# Patient Record
Sex: Female | Born: 1975 | Race: White | Hispanic: No | Smoking: Current every day smoker
Health system: Southern US, Community
[De-identification: ages and names within clinical notes are randomized; demographics above are authoritative.]

## PROBLEM LIST (undated history)

## (undated) DIAGNOSIS — J45909 Unspecified asthma, uncomplicated: Secondary | ICD-10-CM

## (undated) DIAGNOSIS — O039 Complete or unspecified spontaneous abortion without complication: Secondary | ICD-10-CM

## (undated) DIAGNOSIS — E785 Hyperlipidemia, unspecified: Secondary | ICD-10-CM

## (undated) DIAGNOSIS — R87619 Unspecified abnormal cytological findings in specimens from cervix uteri: Secondary | ICD-10-CM

## (undated) DIAGNOSIS — Z87442 Personal history of urinary calculi: Secondary | ICD-10-CM

## (undated) DIAGNOSIS — N200 Calculus of kidney: Secondary | ICD-10-CM

## (undated) DIAGNOSIS — I10 Essential (primary) hypertension: Secondary | ICD-10-CM

## (undated) DIAGNOSIS — K219 Gastro-esophageal reflux disease without esophagitis: Secondary | ICD-10-CM

## (undated) DIAGNOSIS — K649 Unspecified hemorrhoids: Secondary | ICD-10-CM

## (undated) DIAGNOSIS — E039 Hypothyroidism, unspecified: Secondary | ICD-10-CM

## (undated) DIAGNOSIS — F329 Major depressive disorder, single episode, unspecified: Secondary | ICD-10-CM

## (undated) DIAGNOSIS — F419 Anxiety disorder, unspecified: Secondary | ICD-10-CM

## (undated) DIAGNOSIS — R7989 Other specified abnormal findings of blood chemistry: Secondary | ICD-10-CM

## (undated) HISTORY — DX: Hyperlipidemia, unspecified: E78.5

## (undated) HISTORY — DX: Unspecified hemorrhoids: K64.9

## (undated) HISTORY — DX: Unspecified abnormal cytological findings in specimens from cervix uteri: R87.619

## (undated) HISTORY — DX: Essential (primary) hypertension: I10

## (undated) HISTORY — DX: Other specified abnormal findings of blood chemistry: R79.89

## (undated) HISTORY — DX: Anxiety disorder, unspecified: F41.9

## (undated) HISTORY — PX: ABDOMINAL HYSTERECTOMY: SHX81

## (undated) HISTORY — DX: Complete or unspecified spontaneous abortion without complication: O03.9

## (undated) HISTORY — DX: Unspecified asthma, uncomplicated: J45.909

## (undated) HISTORY — DX: Calculus of kidney: N20.0

## (undated) HISTORY — PX: OTHER SURGICAL HISTORY: SHX169

---
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
## 1999-09-30 DIAGNOSIS — N200 Calculus of kidney: Secondary | ICD-10-CM

## 1999-09-30 HISTORY — DX: Calculus of kidney: N20.0

## 2005-01-11 ENCOUNTER — Observation Stay: Payer: Self-pay

## 2005-01-12 ENCOUNTER — Inpatient Hospital Stay: Payer: Self-pay | Admitting: Unknown Physician Specialty

## 2009-11-09 ENCOUNTER — Ambulatory Visit: Payer: Self-pay | Admitting: Unknown Physician Specialty

## 2012-09-29 HISTORY — PX: ANAL SPHINCTEROTOMY: SHX1140

## 2012-09-29 HISTORY — PX: ANAL FISSURECTOMY: SUR608

## 2012-12-23 ENCOUNTER — Ambulatory Visit (INDEPENDENT_AMBULATORY_CARE_PROVIDER_SITE_OTHER): Payer: BC Managed Care – PPO | Admitting: General Surgery

## 2012-12-23 ENCOUNTER — Encounter: Payer: Self-pay | Admitting: General Surgery

## 2012-12-23 VITALS — BP 132/78 | HR 72 | Resp 12 | Ht 62.0 in | Wt 179.0 lb

## 2012-12-23 DIAGNOSIS — K602 Anal fissure, unspecified: Secondary | ICD-10-CM

## 2012-12-23 NOTE — Patient Instructions (Addendum)
Patient's surgery has been scheduled for 12-30-12 at [facility]. Procedure, risks/benefits explained to her and she is agreeable.

## 2012-12-23 NOTE — Progress Notes (Signed)
Patient ID: [name], female   DOB: [birth year], 37 y.o.   MRN: [id number]  Chief Complaint  Patient presents with  . Rectal Problems    hemorrhoids    HPI Jacqueline Montoya is a 37 y.o. female. Patient here today for hemorrhoid evaluation.  States for several years she has been bothered by hemorrhoids but for past 6 months the pain and bleeding seems to be worse. Primary complaint is pain with BM.  States she has bleeding with BM but stool softeners have helped.Using lidocaine/hydrocorticone cream as needed. HPI  Past Medical History  Diagnosis Date  . Hemorrhoid   . Kidney stone 2001  . Asthma     seasonal    Past Surgical History  Procedure Laterality Date  . Leap    . Cesarean section      Family History  Problem Relation Age of Onset  . Diabetes Father     Social History History  Substance Use Topics  . Smoking status: Former Smoker    Quit date: 09/29/2004  . Smokeless tobacco: Never Used  . Alcohol Use: Yes     Comment: occasionally    No Known Allergies  Current Outpatient Prescriptions  Medication Sig Dispense Refill  . cetirizine (ZYRTEC) 10 MG tablet Take 10 mg by mouth daily.       No current facility-administered medications for this visit.    Review of Systems Review of Systems  Constitutional: Negative.   Respiratory: Negative.   Cardiovascular: Negative.     Blood pressure 132/78, pulse 72, resp. rate 12, height 5\' 2"  (1.575 m), weight 179 lb (81.194 kg), last menstrual period 12/09/2012.  Physical Exam Physical Exam  Constitutional: She is oriented to person, place, and time. She appears well-developed and well-nourished.  Cardiovascular: Normal rate and regular rhythm.   Pulmonary/Chest: Effort normal and breath sounds normal.  Lymphadenopathy:    She has no cervical adenopathy.  Neurological: She is alert and oriented to person, place, and time.  Skin: Skin is warm.  Posterior Anal fissure with skin tag. Marked spasm with sphincter  muscle. No digital exam preformed due to pain.   Data Reviewed    Assessment    Anal fissure     Plan    Discussed surgical treatment-sphincterotomy.        Dorathy Daft M 12/23/2012, 3:23 PM

## 2012-12-24 ENCOUNTER — Encounter: Payer: Self-pay | Admitting: General Surgery

## 2012-12-24 ENCOUNTER — Other Ambulatory Visit: Payer: Self-pay | Admitting: General Surgery

## 2012-12-24 DIAGNOSIS — K602 Anal fissure, unspecified: Secondary | ICD-10-CM

## 2012-12-30 ENCOUNTER — Ambulatory Visit: Payer: Self-pay | Admitting: General Surgery

## 2012-12-30 DIAGNOSIS — K602 Anal fissure, unspecified: Secondary | ICD-10-CM

## 2013-01-03 ENCOUNTER — Encounter: Payer: Self-pay | Admitting: General Surgery

## 2013-01-04 ENCOUNTER — Encounter: Payer: Self-pay | Admitting: General Surgery

## 2013-01-12 ENCOUNTER — Ambulatory Visit (INDEPENDENT_AMBULATORY_CARE_PROVIDER_SITE_OTHER): Payer: BC Managed Care – PPO | Admitting: General Surgery

## 2013-01-12 ENCOUNTER — Encounter: Payer: Self-pay | Admitting: General Surgery

## 2013-01-12 VITALS — BP 124/72 | HR 80 | Resp 14 | Ht 62.0 in | Wt 178.0 lb

## 2013-01-12 DIAGNOSIS — K602 Anal fissure, unspecified: Secondary | ICD-10-CM

## 2013-01-12 NOTE — Progress Notes (Signed)
Patient ID: Jacqueline Montoya, female   DOB: [birth year], 37 y.o.   MRN: [id number]  Chief Complaint  Patient presents with  . Routine Post Op    anal sphincterotomy, fissurectomy    HPI Jacqueline Montoya is a 37 y.o. female who presents for a post op follow up appointment after an anal sphincterotomy and fissurectomy. Surgery was performed on 12/30/12. She states that surgery went well. She has no complaints at this time.  HPI  Past Medical History  Diagnosis Date  . Hemorrhoid   . Kidney stone 2001  . Asthma     seasonal    Past Surgical History  Procedure Laterality Date  . Leap    . Cesarean section    . Anal fissurectomy  2014  . Anal sphincterotomy  2014    Family History  Problem Relation Age of Onset  . Diabetes Father     Social History History  Substance Use Topics  . Smoking status: Former Smoker    Quit date: 09/29/2004  . Smokeless tobacco: Never Used  . Alcohol Use: Yes     Comment: occasionally    No Known Allergies  Current Outpatient Prescriptions  Medication Sig Dispense Refill  . cetirizine (ZYRTEC) 10 MG tablet Take 10 mg by mouth daily.       No current facility-administered medications for this visit.    Review of Systems Review of Systems  Constitutional: Negative.   Respiratory: Negative.   Cardiovascular: Negative.   Gastrointestinal: Negative.     Blood pressure 124/72, pulse 80, resp. rate 14, height 5\' 2"  (1.575 m), weight 178 lb (80.74 kg), last menstrual period 12/09/2012.  Physical Exam Physical Exam rectal exam shows a healing anal fissure. No signs of infection  Data Reviewed    Assessment    Postop anal sphincterotomy doing well    Plan    1 month followup       SANKAR,SEEPLAPUTHUR G 01/13/2013, 3:17 PM

## 2013-01-12 NOTE — Patient Instructions (Addendum)
Patient to return in 1 month. Advised to discontinue use of stool softeners but may use Miralax or fiber supplement.

## 2013-01-13 ENCOUNTER — Encounter: Payer: Self-pay | Admitting: General Surgery

## 2013-02-17 ENCOUNTER — Encounter: Payer: Self-pay | Admitting: General Surgery

## 2013-02-17 ENCOUNTER — Ambulatory Visit (INDEPENDENT_AMBULATORY_CARE_PROVIDER_SITE_OTHER): Payer: BC Managed Care – PPO | Admitting: General Surgery

## 2013-02-17 VITALS — BP 110/74 | HR 78 | Resp 12 | Ht 62.0 in | Wt 170.0 lb

## 2013-02-17 DIAGNOSIS — K602 Anal fissure, unspecified: Secondary | ICD-10-CM

## 2013-02-17 NOTE — Patient Instructions (Signed)
F/U prn

## 2013-02-17 NOTE — Progress Notes (Signed)
Patient ID: Jacqueline Montoya, female   DOB: 1975/10/09, 37 y.o.   MRN: [id number] This is a 37 year old female here today for her post of anal fissure sphinctertomcy on 12/30/12. Patient states she is doing well. Exam shows a well healed sphinctertomy site and fissure. Good results post treatment for anal fissure.

## 2014-07-31 ENCOUNTER — Encounter: Payer: Self-pay | Admitting: General Surgery

## 2015-01-19 NOTE — Op Note (Signed)
PATIENT NAME:  Jacqueline Montoya, Jacqueline Montoya MR#:  [id number] DATE OF BIRTH:  1976/08/29  DATE OF PROCEDURE:  12/30/2012  PREOPERATIVE DIAGNOSIS: Posterior anal fissure.   POSTOPERATIVE DIAGNOSIS: Posterior anal fissure.   OPERATION: Lateral internal sphincterotomy and excision of simple tag of skin.   SURGEON: Mckinley Jewel, M.D.   ANESTHESIA: General and local 0.5% Marcaine, 10 mL.   COMPLICATIONS: None.   ESTIMATED BLOOD LOSS: Minimal.   DRAINS: None.   PROCEDURE: The patient was put to sleep with an LMA and placed in the lithotomy position on the operating table. The anal area was prepped and draped out as a sterile field. The patient had a well-defined, deep, 6 to 7 mm posterior anal fissure covered with a simple tag of skin. Also noted was a small external hemorrhoid on the right side, mildly inflamed but otherwise uninvolved. Digital exam and speculum exam showed no other abnormality. The simple tag of skin was excised out and cauterized. The edge of the internal sphincter muscle at the 3 and 9 o'clock positions was exposed through its 2 small radial incisions. The edge was lifted up and cut with cautery, and the openings of the skin closed with buried stitches of 3-0 Vicryl. Then 10 mL of 0.5% Marcaine was instilled around the perianal and intersphincteric area for postop analgesia. The area was covered with bacitracin ointment and dressed. The procedure was well tolerated. She was subsequently returned to the recovery room in stable condition.    ____________________________ S.Robinette Haines, MD sgs:dm D: 12/30/2012 11:10:00 ET T: 12/30/2012 11:31:12 ET JOB#: [id number]  cc: S.G. Jamal Collin, MD, <Dictator> [facility] Robinette Haines MD ELECTRONICALLY SIGNED 01/01/2013 9:08

## 2017-01-30 ENCOUNTER — Other Ambulatory Visit: Payer: Self-pay | Admitting: Obstetrics and Gynecology

## 2017-02-11 ENCOUNTER — Encounter: Payer: BC Managed Care – PPO | Admitting: Obstetrics and Gynecology

## 2017-02-11 ENCOUNTER — Telehealth: Payer: Self-pay

## 2017-02-11 ENCOUNTER — Other Ambulatory Visit: Payer: Self-pay | Admitting: Obstetrics and Gynecology

## 2017-02-11 ENCOUNTER — Other Ambulatory Visit: Payer: BC Managed Care – PPO

## 2017-02-11 DIAGNOSIS — E039 Hypothyroidism, unspecified: Secondary | ICD-10-CM

## 2017-02-11 NOTE — Progress Notes (Signed)
Thyroid labs due.

## 2017-02-11 NOTE — Telephone Encounter (Signed)
Pt calling in on cycle for today's appt.  Her next appt is 6/27th.  Calling to see if [facility] wants her to go ahead and have thyroid ck'd now or wait 'til June?  Pt aware per [facility] to have labs done now and at this location.

## 2017-02-12 LAB — TSH+FREE T4
Free T4: 1.11 ng/dL (ref 0.82–1.77)
TSH: 4.48 u[IU]/mL (ref 0.450–4.500)

## 2017-02-16 ENCOUNTER — Telehealth: Payer: Self-pay | Admitting: Obstetrics and Gynecology

## 2017-02-16 DIAGNOSIS — E039 Hypothyroidism, unspecified: Secondary | ICD-10-CM

## 2017-02-16 MED ORDER — LEVOTHYROXINE SODIUM 25 MCG PO TABS: ORAL_TABLET | ORAL | 5 refills | Status: DC

## 2017-02-16 MED ORDER — NORETHIN ACE-ETH ESTRAD-FE 1-20 MG-MCG PO TABS: 1.0000 | ORAL_TABLET | Freq: Every day | ORAL | 0 refills | Status: DC

## 2017-02-16 NOTE — Telephone Encounter (Signed)
Pt aware of WNL TSH and free T4. She feels good on levo 25 mcg daily with 2 tabs Wed, Fri and Sun. Rx RF. Rechk in 6 months. Orders in computer. Pt has annual next month.

## 2017-03-06 ENCOUNTER — Other Ambulatory Visit: Payer: Self-pay | Admitting: Obstetrics and Gynecology

## 2017-03-25 ENCOUNTER — Ambulatory Visit (INDEPENDENT_AMBULATORY_CARE_PROVIDER_SITE_OTHER): Payer: BC Managed Care – PPO | Admitting: Obstetrics and Gynecology

## 2017-03-25 ENCOUNTER — Encounter: Payer: Self-pay | Admitting: Obstetrics and Gynecology

## 2017-03-25 VITALS — BP 116/74 | Ht 62.0 in | Wt 172.0 lb

## 2017-03-25 DIAGNOSIS — N946 Dysmenorrhea, unspecified: Secondary | ICD-10-CM | POA: Insufficient documentation

## 2017-03-25 DIAGNOSIS — Z3041 Encounter for surveillance of contraceptive pills: Secondary | ICD-10-CM

## 2017-03-25 DIAGNOSIS — Z1239 Encounter for other screening for malignant neoplasm of breast: Secondary | ICD-10-CM

## 2017-03-25 DIAGNOSIS — Z124 Encounter for screening for malignant neoplasm of cervix: Secondary | ICD-10-CM | POA: Diagnosis not present

## 2017-03-25 DIAGNOSIS — Z01419 Encounter for gynecological examination (general) (routine) without abnormal findings: Secondary | ICD-10-CM | POA: Diagnosis not present

## 2017-03-25 DIAGNOSIS — Z1231 Encounter for screening mammogram for malignant neoplasm of breast: Secondary | ICD-10-CM

## 2017-03-25 DIAGNOSIS — Z1151 Encounter for screening for human papillomavirus (HPV): Secondary | ICD-10-CM | POA: Diagnosis not present

## 2017-03-25 MED ORDER — NORETHIN ACE-ETH ESTRAD-FE 1-20 MG-MCG PO TABS: ORAL_TABLET | ORAL | 12 refills | Status: DC

## 2017-03-25 MED ORDER — NAPROXEN SODIUM 550 MG PO TABS: 550.0000 mg | ORAL_TABLET | Freq: Two times a day (BID) | ORAL | 1 refills | Status: DC

## 2017-03-25 NOTE — Progress Notes (Signed)
Chief Complaint  Patient presents with  . Annual Exam     HPI:      [name] is a 41 y.o. G2P1011 who LMP was Patient's last menstrual period was 03/09/2017., presents today for her annual examination.  Her menses are regular every 28-30 days, lasting 3 days.  Dysmenorrhea mild, occurring first 1-2 days of flow. She does not have intermenstrual bleeding. Her dysmen sx are much improved with OCP use. She also takes anaprox prn with sx improvement.  Sex activity: single partner, contraception - OCP (estrogen/progesterone).  Last Pap: December 08, 2013  Results were: no abnormalities /neg HPV DNA  Hx of STDs: none  Last mammogram: Feb 04, 2016  Results were: normal--routine follow-up in 12 months There is no FH of breast cancer. There is no FH of ovarian cancer. The patient does do self-breast exams.  Tobacco use: The patient denies current or previous tobacco use. Alcohol use: none Exercise: moderately active  She does get adequate calcium and Vitamin D in her diet.  She takes levothyroxine for hypothyroidism. She had normal labs 5/18 and is due for repeat labs 11/18 (orders in). She is doing well with current dose.  Past Medical History:  Diagnosis Date  . Abnormal Pap smear of cervix   . Anxiety   . Asthma    seasonal  . Elevated TSH   . Hemorrhoid   . Hemorrhoids   . Kidney stone 2001  . Spontaneous abortion     Past Surgical History:  Procedure Laterality Date  . ANAL FISSURECTOMY  2014  . ANAL SPHINCTEROTOMY  2014  . CESAREAN SECTION    . leap      Family History  Problem Relation Age of Onset  . Diabetes Father   . Hypothyroidism Mother   . Uterine cancer Maternal Aunt     Social History   Social History  . Marital status: Married    Spouse name: N/A  . Number of children: N/A  . Years of education: N/A   Occupational History  . Not on file.   Social History Main Topics  . Smoking status: Former Smoker    Quit date: 09/29/2004  . Smokeless  tobacco: Never Used  . Alcohol use Yes     Comment: occasionally  . Drug use: No  . Sexual activity: Yes    Birth control/ protection: Pill   Other Topics Concern  . Not on file   Social History Narrative  . No narrative on file     Current Outpatient Prescriptions:  .  cetirizine (ZYRTEC) 10 MG tablet, Take 10 mg by mouth daily., Disp: , Rfl:  .  levothyroxine (SYNTHROID, LEVOTHROID) 25 MCG tablet, take 1 tablet by mouth once daily EXCEPT ON WEDNESDAYS, FRIDAYS AND SUNDAYS TAKE 2 TABLETS, Disp: 45 tablet, Rfl: 0 .  norethindrone-ethinyl estradiol (JUNEL FE 1/20) 1-20 MG-MCG tablet, take 1 tablet by mouth once daily, Disp: 28 tablet, Rfl: 12 .  naproxen sodium (ANAPROX DS) 550 MG tablet, Take 1 tablet (550 mg total) by mouth 2 (two) times daily with a meal., Disp: 30 tablet, Rfl: 1  ROS:  Review of Systems  Constitutional: Negative for fatigue, fever and unexpected weight change.  Respiratory: Negative for cough, shortness of breath and wheezing.   Cardiovascular: Negative for chest pain, palpitations and leg swelling.  Gastrointestinal: Negative for blood in stool, constipation, diarrhea, nausea and vomiting.  Endocrine: Negative for cold intolerance, heat intolerance and polyuria.  Genitourinary: Negative for dyspareunia,  dysuria, flank pain, frequency, genital sores, hematuria, menstrual problem, pelvic pain, urgency, vaginal bleeding, vaginal discharge and vaginal pain.  Musculoskeletal: Negative for back pain, joint swelling and myalgias.  Skin: Negative for rash.  Neurological: Negative for dizziness, syncope, light-headedness, numbness and headaches.  Hematological: Negative for adenopathy.  Psychiatric/Behavioral: Negative for agitation, confusion, sleep disturbance and suicidal ideas. The patient is not nervous/anxious.      Objective: BP 116/74   Ht 5\' 2"  (1.575 m)   Wt 172 lb (78 kg)   LMP 03/09/2017   BMI 31.46 kg/m    Physical Exam  Constitutional: She is  oriented to person, place, and time. She appears well-developed and well-nourished.  Genitourinary: Vagina normal and uterus normal. There is no rash or tenderness on the right labia. There is no rash or tenderness on the left labia. No erythema or tenderness in the vagina. No vaginal discharge found. Right adnexum does not display mass and does not display tenderness. Left adnexum does not display mass and does not display tenderness. Cervix does not exhibit motion tenderness or polyp. Uterus is not enlarged or tender.  Neck: Normal range of motion. No thyromegaly present.  Cardiovascular: Normal rate, regular rhythm and normal heart sounds.   No murmur heard. Pulmonary/Chest: Effort normal and breath sounds normal. Right breast exhibits no mass, no nipple discharge, no skin change and no tenderness. Left breast exhibits no mass, no nipple discharge, no skin change and no tenderness.  Abdominal: Soft. There is no tenderness. There is no guarding.  Musculoskeletal: Normal range of motion.  Neurological: She is alert and oriented to person, place, and time. No cranial nerve deficit.  Psychiatric: She has a normal mood and affect. Her behavior is normal.  Vitals reviewed.    Assessment/Plan: Encounter for annual routine gynecological examination  Cervical cancer screening - Plan: IGP, Aptima HPV  Screening for HPV (human papillomavirus) - Plan: IGP, Aptima HPV  Encounter for surveillance of contraceptive pills - OCP RF.  - Plan: norethindrone-ethinyl estradiol (JUNEL FE 1/20) 1-20 MG-MCG tablet  Screening for breast cancer - Pt to sched mammo. - Plan: MM DIGITAL SCREENING BILATERAL  Dysmenorrhea - Improved with OCPs. Rx RF anaprox. - Plan: norethindrone-ethinyl estradiol (JUNEL FE 1/20) 1-20 MG-MCG tablet, naproxen sodium (ANAPROX DS) 550 MG tablet             GYN counsel mammography screening, adequate intake of calcium and vitamin D, diet and exercise     F/U  Return in about 1 year  (around 03/25/2018).  Alicia B. Copland, PA-C 03/25/2017 9:57 AM

## 2017-03-27 LAB — IGP, APTIMA HPV
HPV Aptima: NEGATIVE
PAP Smear Comment: 0

## 2017-04-22 ENCOUNTER — Ambulatory Visit
Admission: RE | Admit: 2017-04-22 | Discharge: 2017-04-22 | Disposition: A | Discharge status: Home / Self Care | Payer: BC Managed Care – PPO | Source: Ambulatory Visit | Attending: Obstetrics and Gynecology | Admitting: Obstetrics and Gynecology

## 2017-04-22 DIAGNOSIS — Z1231 Encounter for screening mammogram for malignant neoplasm of breast: Secondary | ICD-10-CM | POA: Diagnosis not present

## 2017-04-22 DIAGNOSIS — Z1239 Encounter for other screening for malignant neoplasm of breast: Secondary | ICD-10-CM

## 2017-04-25 ENCOUNTER — Other Ambulatory Visit: Payer: Self-pay | Admitting: Obstetrics and Gynecology

## 2017-04-28 ENCOUNTER — Inpatient Hospital Stay
Admission: RE | Admit: 2017-04-28 | Discharge: 2017-04-28 | Disposition: A | Discharge status: Home / Self Care | Payer: Self-pay | Source: Ambulatory Visit | Attending: *Deleted | Admitting: *Deleted

## 2017-04-28 ENCOUNTER — Other Ambulatory Visit: Payer: Self-pay | Admitting: *Deleted

## 2017-04-28 DIAGNOSIS — Z9289 Personal history of other medical treatment: Secondary | ICD-10-CM

## 2017-04-29 ENCOUNTER — Other Ambulatory Visit: Payer: Self-pay | Admitting: Obstetrics and Gynecology

## 2017-04-29 DIAGNOSIS — R928 Other abnormal and inconclusive findings on diagnostic imaging of breast: Secondary | ICD-10-CM

## 2017-04-29 DIAGNOSIS — R921 Mammographic calcification found on diagnostic imaging of breast: Secondary | ICD-10-CM

## 2017-05-04 ENCOUNTER — Ambulatory Visit
Admission: RE | Admit: 2017-05-04 | Discharge: 2017-05-04 | Disposition: A | Discharge status: Home / Self Care | Payer: BC Managed Care – PPO | Source: Ambulatory Visit | Attending: Obstetrics and Gynecology | Admitting: Obstetrics and Gynecology

## 2017-05-04 ENCOUNTER — Other Ambulatory Visit: Payer: Self-pay | Admitting: Obstetrics and Gynecology

## 2017-05-04 DIAGNOSIS — R921 Mammographic calcification found on diagnostic imaging of breast: Secondary | ICD-10-CM

## 2017-05-04 DIAGNOSIS — R928 Other abnormal and inconclusive findings on diagnostic imaging of breast: Secondary | ICD-10-CM | POA: Diagnosis present

## 2017-05-05 ENCOUNTER — Encounter: Payer: Self-pay | Admitting: Obstetrics and Gynecology

## 2017-08-04 ENCOUNTER — Telehealth: Payer: Self-pay | Admitting: Obstetrics and Gynecology

## 2017-08-04 NOTE — Telephone Encounter (Signed)
Pt is requesting to have her labs sent to another [facility]. Due to scheduling times she isn't available please advise.

## 2017-08-05 ENCOUNTER — Other Ambulatory Visit: Payer: Self-pay | Admitting: Obstetrics and Gynecology

## 2017-08-05 DIAGNOSIS — E039 Hypothyroidism, unspecified: Secondary | ICD-10-CM

## 2017-08-05 NOTE — Telephone Encounter (Signed)
Orders changed to external [facility].

## 2017-08-10 ENCOUNTER — Other Ambulatory Visit: Payer: BC Managed Care – PPO

## 2017-08-11 ENCOUNTER — Telehealth: Payer: Self-pay | Admitting: Obstetrics and Gynecology

## 2017-08-11 ENCOUNTER — Other Ambulatory Visit: Payer: Self-pay | Admitting: Obstetrics and Gynecology

## 2017-08-11 DIAGNOSIS — N946 Dysmenorrhea, unspecified: Secondary | ICD-10-CM

## 2017-08-11 DIAGNOSIS — E039 Hypothyroidism, unspecified: Secondary | ICD-10-CM

## 2017-08-11 LAB — TSH+FREE T4
FREE T4: 1.07 ng/dL (ref 0.82–1.77)
TSH: 5.28 u[IU]/mL — ABNORMAL HIGH (ref 0.450–4.500)

## 2017-08-11 MED ORDER — LORAZEPAM 0.5 MG PO TABS: 0.5000 mg | ORAL_TABLET | Freq: Three times a day (TID) | ORAL | 0 refills | Status: DC | PRN

## 2017-08-11 MED ORDER — NAPROXEN SODIUM 550 MG PO TABS: 550.0000 mg | ORAL_TABLET | Freq: Two times a day (BID) | ORAL | 1 refills | Status: DC

## 2017-08-11 MED ORDER — LEVOTHYROXINE SODIUM 25 MCG PO TABS: ORAL_TABLET | ORAL | 0 refills | Status: DC

## 2017-08-11 NOTE — Progress Notes (Signed)
Rx RF lorazepam prn anxiety--uses sparingly.  Rx RF anaprox prn dysmen.

## 2017-08-11 NOTE — Telephone Encounter (Signed)
Pt aware of thyroid labs. Pt feels "off". She is taking levo 25 mcg daily except 2 tabs Wed, Fri and Sun. TSH is slightly elevated with normal free T4. Given sx of pt, will change to levo 25 mcg 1 1/2 tabs daily (37.5 mcg vs 35 mcg daily). Rechk labs in 4 wks. Rx eRxd.

## 2017-08-31 ENCOUNTER — Other Ambulatory Visit: Payer: Self-pay | Admitting: Obstetrics and Gynecology

## 2017-09-24 ENCOUNTER — Other Ambulatory Visit: Payer: BC Managed Care – PPO

## 2017-09-24 DIAGNOSIS — E039 Hypothyroidism, unspecified: Secondary | ICD-10-CM

## 2017-09-25 ENCOUNTER — Telehealth: Payer: Self-pay | Admitting: Obstetrics and Gynecology

## 2017-09-25 DIAGNOSIS — E039 Hypothyroidism, unspecified: Secondary | ICD-10-CM

## 2017-09-25 LAB — TSH+FREE T4
FREE T4: 1.09 ng/dL (ref 0.82–1.77)
TSH: 4.23 u[IU]/mL (ref 0.450–4.500)

## 2017-09-25 MED ORDER — LEVOTHYROXINE SODIUM 25 MCG PO TABS: ORAL_TABLET | ORAL | 0 refills | Status: DC

## 2017-09-25 NOTE — Telephone Encounter (Signed)
Pt aware. Still not feeling as energetic as she was. Taking levo 25 mcg 1 1/2 tabs (37.5 mg) daily. Will increase to 2 tabs Wed and Sun. Rechk labs in 4 wks. Rx eRxd.

## 2017-10-31 ENCOUNTER — Other Ambulatory Visit: Payer: Self-pay | Admitting: Obstetrics and Gynecology

## 2017-10-31 DIAGNOSIS — E039 Hypothyroidism, unspecified: Secondary | ICD-10-CM

## 2017-11-04 ENCOUNTER — Other Ambulatory Visit: Payer: BC Managed Care – PPO

## 2017-11-04 DIAGNOSIS — E039 Hypothyroidism, unspecified: Secondary | ICD-10-CM

## 2017-11-05 ENCOUNTER — Telehealth: Payer: Self-pay | Admitting: Obstetrics and Gynecology

## 2017-11-05 DIAGNOSIS — E039 Hypothyroidism, unspecified: Secondary | ICD-10-CM

## 2017-11-05 LAB — TSH+FREE T4
FREE T4: 1.07 ng/dL (ref 0.82–1.77)
TSH: 3.95 u[IU]/mL (ref 0.450–4.500)

## 2017-11-05 MED ORDER — LEVOTHYROXINE SODIUM 25 MCG PO TABS: ORAL_TABLET | ORAL | 4 refills | Status: DC

## 2017-11-05 NOTE — Telephone Encounter (Signed)
Pt aware of normal thyroid labs. Still not as energetic. Not exercising. Increase exercise/lots of water, fruits/veggies, add MVI. Rx RF levo till 6/19 appt. Levo 25 mcg, 1 1/2 tabs daily except 2 on Wed and Sun.  F/u sooner prn.

## 2018-01-06 IMAGING — MG MM DIGITAL SCREENING BILAT W/ CAD
4 series · 4 of 4 positions shown · non-contrast
Comparison: Previous exam(s).

CLINICAL DATA: Screening.

EXAM:
DIGITAL SCREENING BILATERAL MAMMOGRAM WITH CAD

[L CC]
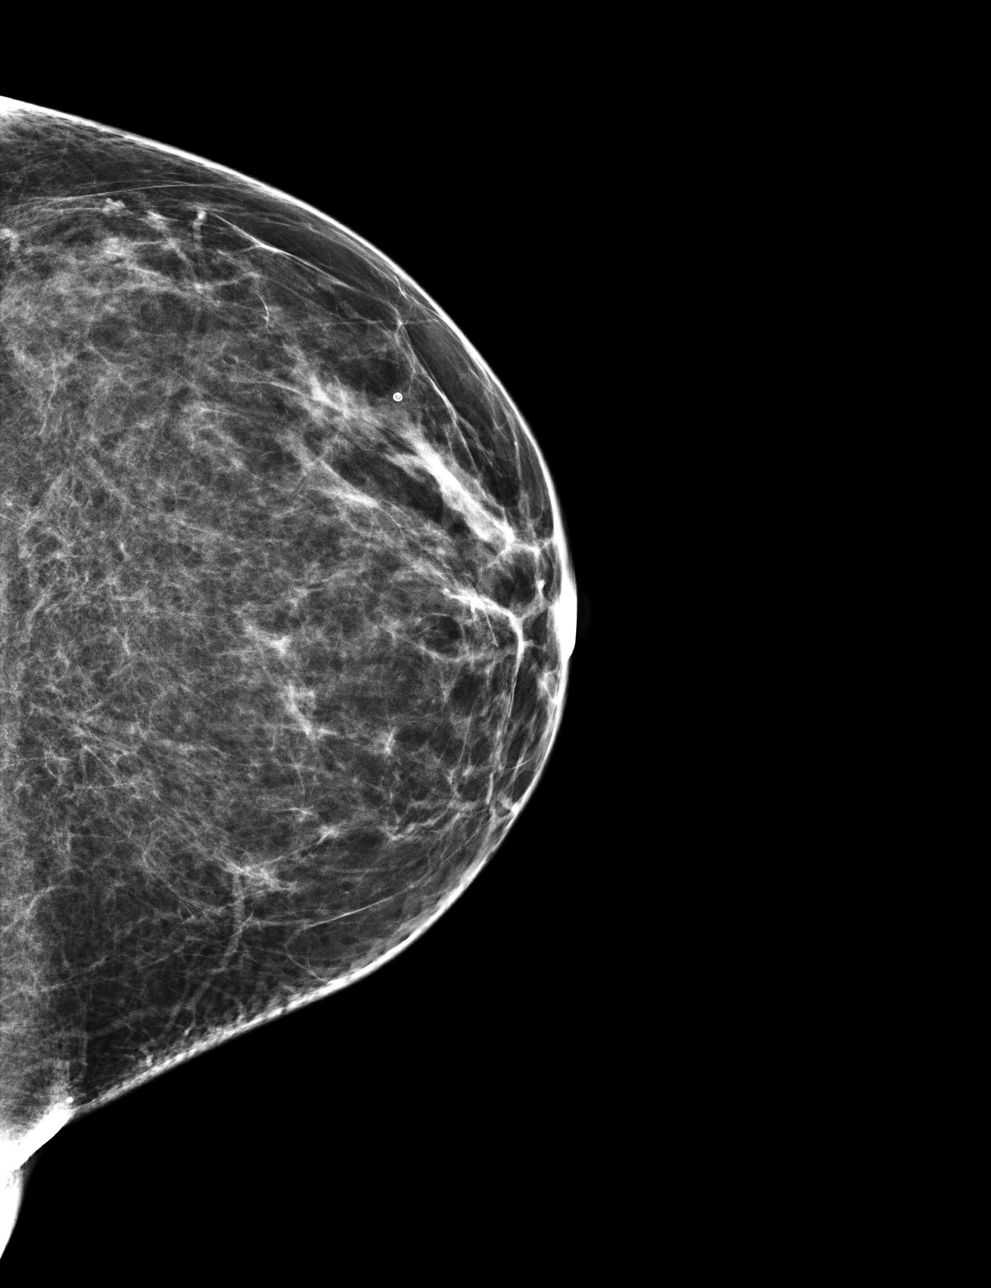

[R MLO]
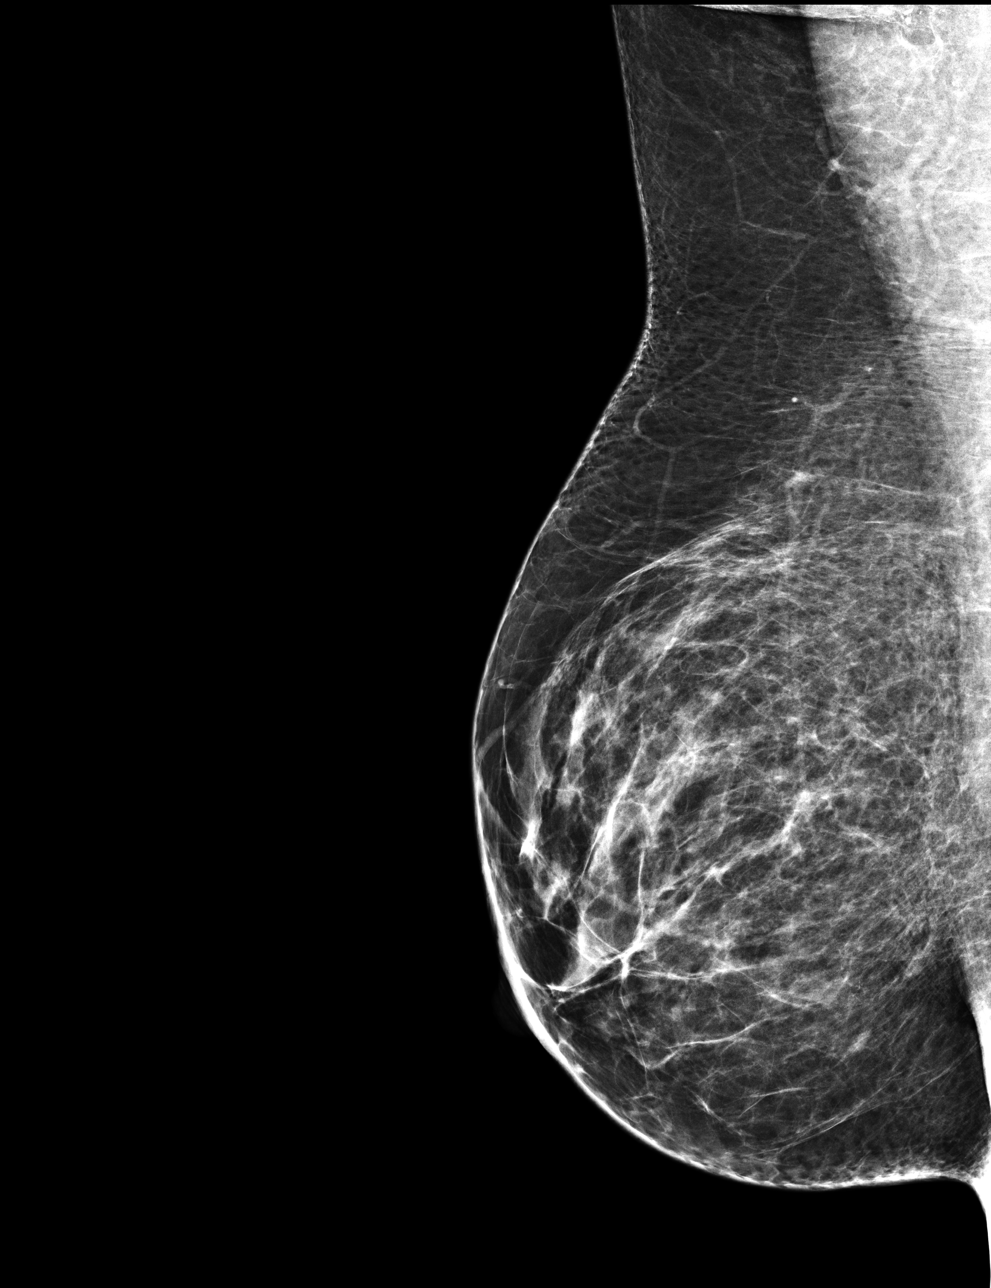

[R CC]
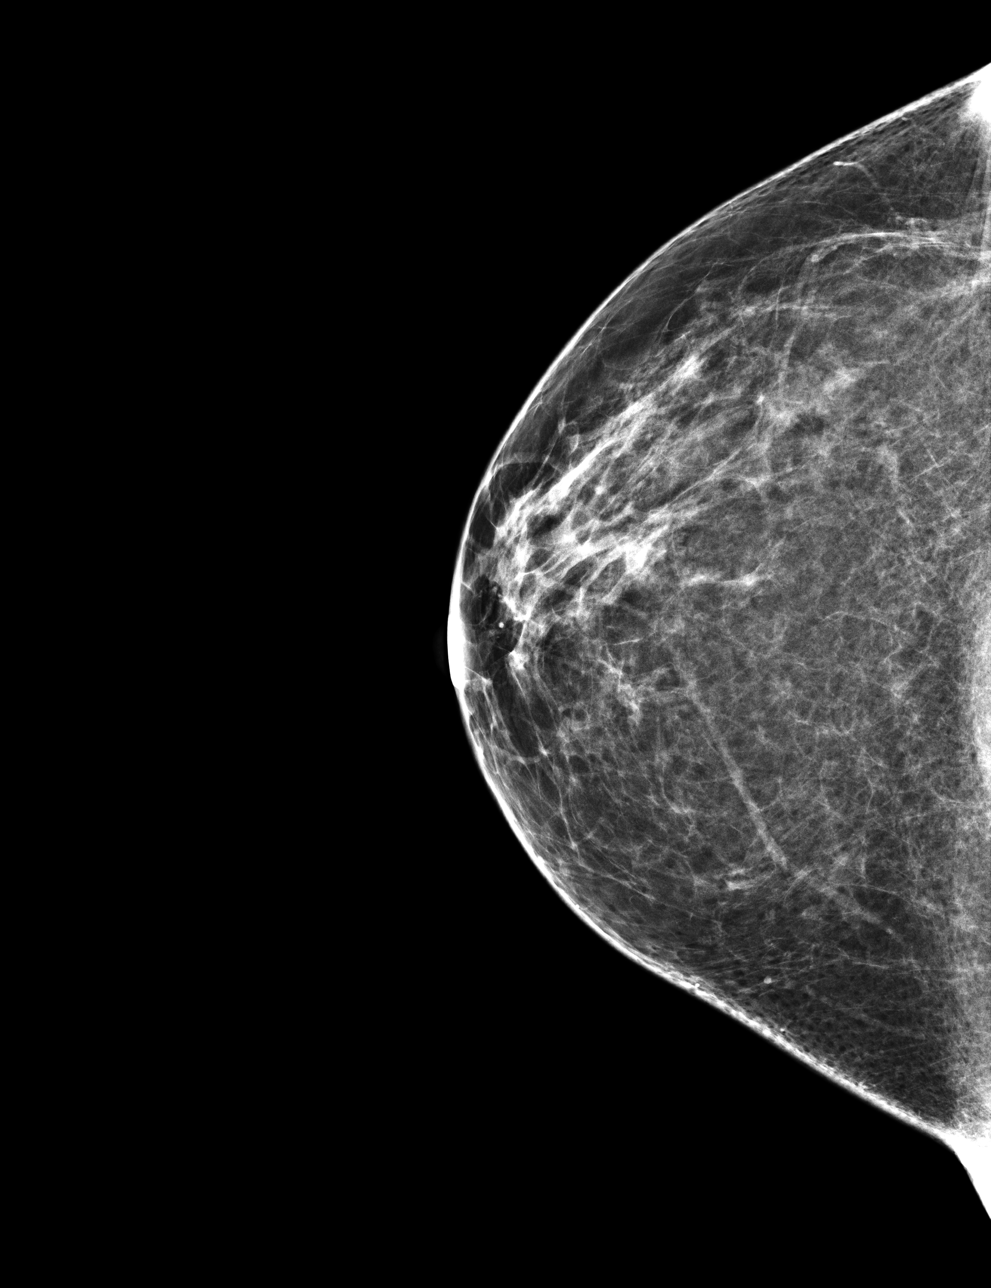

[L MLO]
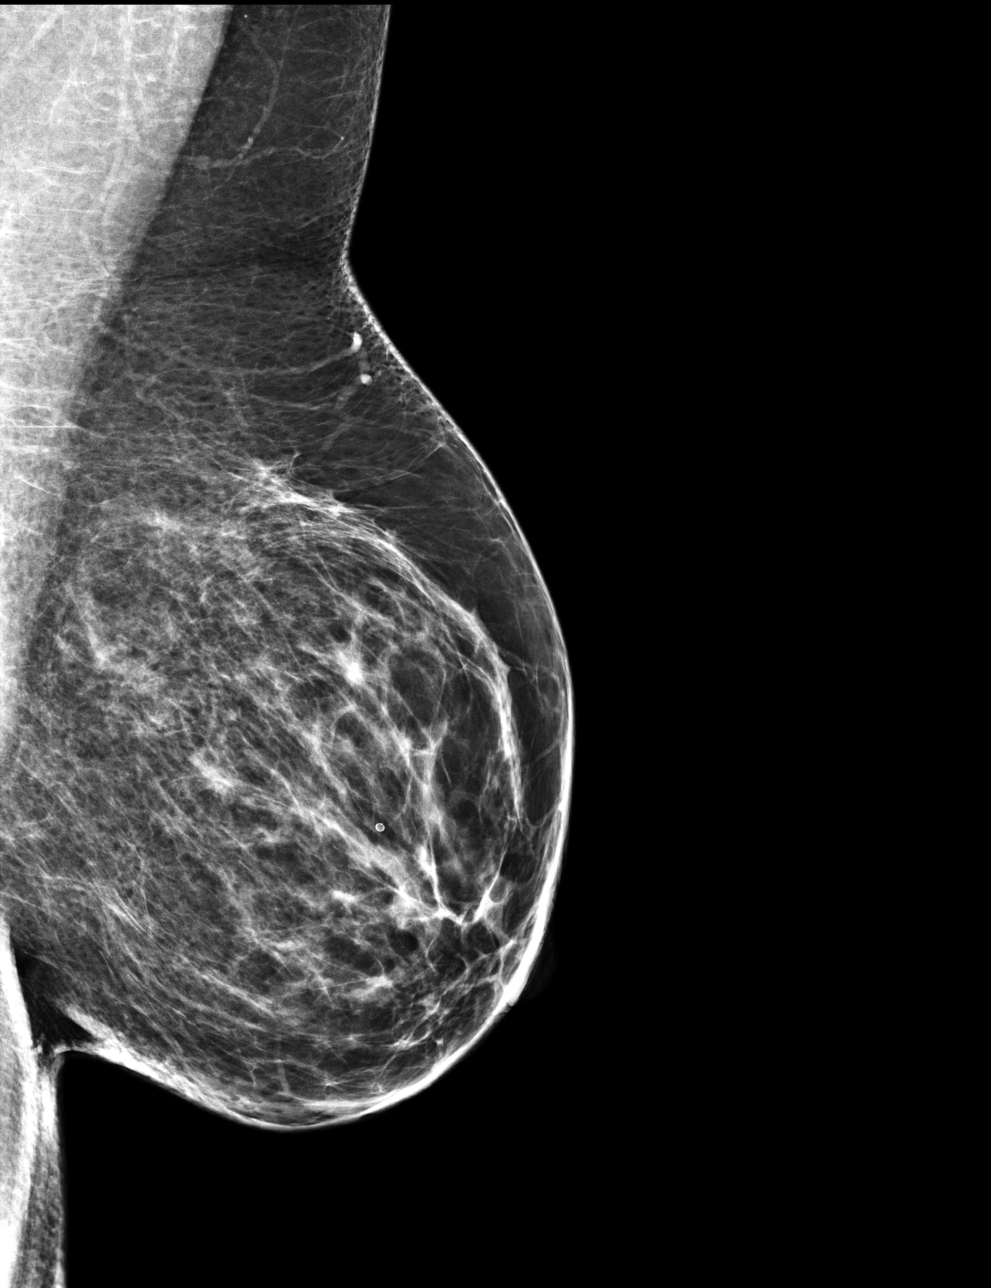

[4 of 4 positions shown; findings below may reference images not displayed]

ACR Breast Density Category b: There are scattered areas of
fibroglandular density.
FINDINGS: In the right breast, possible calcifications warrant further
evaluation with magnified views. These possible calcifications,
versus deodorant, are seen within the region of the right axilla.

In the left breast, no findings suspicious for malignancy. Images
were processed with CAD.
IMPRESSION: Further evaluation is suggested for possible calcifications, versus
deodorant, in the right breast/axilla.

RECOMMENDATION:
Diagnostic mammogram of the right breast. (Code:D0-4-XXL)

The patient will be contacted regarding the findings, and additional
imaging will be scheduled.

BI-RADS CATEGORY  0: Incomplete. Need additional imaging evaluation
and/or prior mammograms for comparison.

## 2018-03-21 ENCOUNTER — Encounter: Payer: Self-pay | Admitting: Obstetrics and Gynecology

## 2018-04-05 ENCOUNTER — Other Ambulatory Visit: Payer: Self-pay | Admitting: Obstetrics and Gynecology

## 2018-04-05 DIAGNOSIS — Z3041 Encounter for surveillance of contraceptive pills: Secondary | ICD-10-CM

## 2018-04-05 DIAGNOSIS — N946 Dysmenorrhea, unspecified: Secondary | ICD-10-CM

## 2018-04-06 ENCOUNTER — Encounter: Payer: Self-pay | Admitting: Obstetrics and Gynecology

## 2018-04-07 ENCOUNTER — Other Ambulatory Visit: Payer: Self-pay | Admitting: Obstetrics and Gynecology

## 2018-04-07 DIAGNOSIS — E039 Hypothyroidism, unspecified: Secondary | ICD-10-CM

## 2018-04-08 ENCOUNTER — Ambulatory Visit (INDEPENDENT_AMBULATORY_CARE_PROVIDER_SITE_OTHER): Payer: BC Managed Care – PPO | Admitting: Obstetrics and Gynecology

## 2018-04-08 ENCOUNTER — Encounter: Payer: Self-pay | Admitting: Obstetrics and Gynecology

## 2018-04-08 VITALS — BP 124/82 | HR 85 | Ht 62.0 in | Wt 182.0 lb

## 2018-04-08 DIAGNOSIS — Z3041 Encounter for surveillance of contraceptive pills: Secondary | ICD-10-CM

## 2018-04-08 DIAGNOSIS — Z01419 Encounter for gynecological examination (general) (routine) without abnormal findings: Secondary | ICD-10-CM

## 2018-04-08 DIAGNOSIS — N946 Dysmenorrhea, unspecified: Secondary | ICD-10-CM | POA: Diagnosis not present

## 2018-04-08 DIAGNOSIS — I10 Essential (primary) hypertension: Secondary | ICD-10-CM | POA: Diagnosis not present

## 2018-04-08 DIAGNOSIS — Z1231 Encounter for screening mammogram for malignant neoplasm of breast: Secondary | ICD-10-CM

## 2018-04-08 DIAGNOSIS — E039 Hypothyroidism, unspecified: Secondary | ICD-10-CM | POA: Diagnosis not present

## 2018-04-08 DIAGNOSIS — F419 Anxiety disorder, unspecified: Secondary | ICD-10-CM | POA: Diagnosis not present

## 2018-04-08 DIAGNOSIS — Z1239 Encounter for other screening for malignant neoplasm of breast: Secondary | ICD-10-CM

## 2018-04-08 MED ORDER — NORETHINDRONE 0.35 MG PO TABS: 1.0000 | ORAL_TABLET | Freq: Every day | ORAL | 3 refills | Status: DC

## 2018-04-08 MED ORDER — LEVOTHYROXINE SODIUM 25 MCG PO TABS: ORAL_TABLET | ORAL | 1 refills | Status: DC

## 2018-04-08 MED ORDER — LORAZEPAM 0.5 MG PO TABS: 0.5000 mg | ORAL_TABLET | Freq: Three times a day (TID) | ORAL | 0 refills | Status: DC | PRN

## 2018-04-08 MED ORDER — NAPROXEN SODIUM 550 MG PO TABS: 550.0000 mg | ORAL_TABLET | Freq: Two times a day (BID) | ORAL | 1 refills | Status: DC

## 2018-04-08 NOTE — Progress Notes (Signed)
Chief Complaint  Patient presents with  . Gynecologic Exam     HPI:      Jacqueline Montoya is a 42 y.o. G2P1011 who LMP was Patient's last menstrual period was 04/06/2018 (exact date)., presents today for her annual examination.  Her menses are regular every 28-30 days, lasting 3 days. Dysmenorrhea mild, occurring first 1-2 days of flow. She does not have intermenstrual bleeding. Her dysmen sx are much improved with OCP use. She also takes anaprox prn with sx improvement.  Sex activity: single partner, contraception - OCP (estrogen/progesterone).  Last Pap: 03/25/17  Results were: no abnormalities /neg HPV DNA  Hx of STDs: none  Last mammogram: 05/04/17  Results were: normal--routine follow-up in 12 months There is no FH of breast cancer. There is no FH of ovarian cancer. The patient does do self-breast exams.  Tobacco use: The patient denies current or previous tobacco use. Alcohol use: none Exercise: moderately active  She does get adequate calcium and Vitamin D in her diet.  She takes levothyroxine for hypothyroidism. She had normal labs 4/19 and is due for repeat labs 12/19 (orders in). She is doing well with current dose of levo 25 mcg 1 1/2 daily except 2 on Wed and Sun.   She was diagnosed with HTN 4/19 and started on losartan with PCP. Doing well with med and has BP control.   She takes lorazepam occas prn anxiety and needs Rx RF.   Past Medical History:  Diagnosis Date  . Abnormal Pap smear of cervix   . Anxiety   . Asthma    seasonal  . Elevated TSH   . Hemorrhoid   . Hemorrhoids   . Hyperlipidemia   . Kidney stone 2001  . Spontaneous abortion     Past Surgical History:  Procedure Laterality Date  . ANAL FISSURECTOMY  2014  . ANAL SPHINCTEROTOMY  2014  . CESAREAN SECTION    . leap      Family History  Problem Relation Age of Onset  . Diabetes Father   . Hypothyroidism Mother   . Uterine cancer Maternal Aunt     Social History    Socioeconomic History  . Marital status: Married    Spouse name: Not on file  . Number of children: Not on file  . Years of education: Not on file  . Highest education level: Not on file  Occupational History  . Not on file  Social Needs  . Financial resource strain: Not on file  . Food insecurity:    Worry: Not on file    Inability: Not on file  . Transportation needs:    Medical: Not on file    Non-medical: Not on file  Tobacco Use  . Smoking status: Former Smoker    Last attempt to quit: 09/29/2004    Years since quitting: 13.5  . Smokeless tobacco: Never Used  Substance and Sexual Activity  . Alcohol use: Yes    Comment: occasionally  . Drug use: No  . Sexual activity: Yes    Birth control/protection: Pill  Lifestyle  . Physical activity:    Days per week: Not on file    Minutes per session: Not on file  . Stress: Not on file  Relationships  . Social connections:    Talks on phone: Not on file    Gets together: Not on file    Attends religious service: Not on file    Active member of club or organization:  Not on file    Attends meetings of clubs or organizations: Not on file    Relationship status: Not on file  . Intimate partner violence:    Fear of current or ex partner: Not on file    Emotionally abused: Not on file    Physically abused: Not on file    Forced sexual activity: Not on file  Other Topics Concern  . Not on file  Social History Narrative  . Not on file    Current Outpatient Medications on File Prior to Visit  Medication Sig Dispense Refill  . cetirizine (ZYRTEC) 10 MG tablet Take 10 mg by mouth daily.    Marland Kitchen losartan (COZAAR) 25 MG tablet   3  . norethindrone-ethinyl estradiol (JUNEL FE 1/20) 1-20 MG-MCG tablet TAKE 1 TABLET BY MOUTH ONCE DAILY 28 tablet 0  . etodolac (LODINE) 500 MG tablet TK 1 T PO BID  0   No current facility-administered medications on file prior to visit.       ROS:  Review of Systems  Constitutional: Negative  for fatigue, fever and unexpected weight change.  Respiratory: Negative for cough, shortness of breath and wheezing.   Cardiovascular: Negative for chest pain, palpitations and leg swelling.  Gastrointestinal: Negative for blood in stool, constipation, diarrhea, nausea and vomiting.  Endocrine: Negative for cold intolerance, heat intolerance and polyuria.  Genitourinary: Negative for dyspareunia, dysuria, flank pain, frequency, genital sores, hematuria, menstrual problem, pelvic pain, urgency, vaginal bleeding, vaginal discharge and vaginal pain.  Musculoskeletal: Negative for back pain, joint swelling and myalgias.  Skin: Negative for rash.  Neurological: Negative for dizziness, syncope, light-headedness, numbness and headaches.  Hematological: Negative for adenopathy.  Psychiatric/Behavioral: Negative for agitation, confusion, sleep disturbance and suicidal ideas. The patient is not nervous/anxious.     Objective: BP 124/82   Pulse 85   Ht 5\' 2"  (1.575 m)   Wt 182 lb (82.6 kg)   LMP 04/06/2018 (Exact Date)   BMI 33.29 kg/m    Physical Exam  Constitutional: She is oriented to person, place, and time. She appears well-developed and well-nourished.  Genitourinary: Vagina normal and uterus normal. There is no rash or tenderness on the right labia. There is no rash or tenderness on the left labia. No erythema or tenderness in the vagina. No vaginal discharge found. Right adnexum does not display mass and does not display tenderness. Left adnexum does not display mass and does not display tenderness. Cervix does not exhibit motion tenderness or polyp. Uterus is not enlarged or tender.  Neck: Normal range of motion. No thyromegaly present.  Cardiovascular: Normal rate, regular rhythm and normal heart sounds.  No murmur heard. Pulmonary/Chest: Effort normal and breath sounds normal. Right breast exhibits no mass, no nipple discharge, no skin change and no tenderness. Left breast exhibits no  mass, no nipple discharge, no skin change and no tenderness.  Abdominal: Soft. There is no tenderness. There is no guarding.  Musculoskeletal: Normal range of motion.  Neurological: She is alert and oriented to person, place, and time. No cranial nerve deficit.  Psychiatric: She has a normal mood and affect. Her behavior is normal.  Vitals reviewed.   Assessment/Plan: Encounter for annual routine gynecological examination  Screening for breast cancer - Plan: MM DIGITAL SCREENING BILATERAL  Encounter for surveillance of contraceptive pills - OCP change to POP due to recent dx of HTN. Rx camila. F/u prn. Discussed depo, nexplanon, and IUD.  - Plan: norethindrone (MICRONOR,CAMILA,ERRIN) 0.35 MG tablet  Dysmenorrhea -  Improved with OCPs. Rx RF anaprox. Follow sx with POPs. F/u prn.  - Plan: naproxen sodium (ANAPROX DS) 550 MG tablet  Essential hypertension - Followed by PCP. Change to POPs.  Acquired hypothyroidism - Rx RF levo. Rechk labs 12/19.  - Plan: TSH + free T4, levothyroxine (SYNTHROID, LEVOTHROID) 25 MCG tablet  Anxiety - Rx RF ativan. Takes sparingly prn. - Plan: LORazepam (ATIVAN) 0.5 MG tablet  Meds ordered this encounter  Medications  . norethindrone (MICRONOR,CAMILA,ERRIN) 0.35 MG tablet    Sig: Take 1 tablet (0.35 mg total) by mouth daily.    Dispense:  84 tablet    Refill:  3    Order Specific Question:   Supervising Provider    Answer:   Gae Dry [id number]  . naproxen sodium (ANAPROX DS) 550 MG tablet    Sig: Take 1 tablet (550 mg total) by mouth 2 (two) times daily with a meal.    Dispense:  30 tablet    Refill:  1    Order Specific Question:   Supervising Provider    Answer:   Gae Dry [id number]  . levothyroxine (SYNTHROID, LEVOTHROID) 25 MCG tablet    Sig: TAKE 1 AND 1/2 TABLETS BY MOUTH DAILY, EXCEPT 2 TABLETS WEDNESDAY AND SUNDAY    Dispense:  50 tablet    Refill:  1    Order Specific Question:   Supervising Provider    Answer:   Gae Dry [id number]  . LORazepam (ATIVAN) 0.5 MG tablet    Sig: Take 1 tablet (0.5 mg total) by mouth every 8 (eight) hours as needed for anxiety.    Dispense:  30 tablet    Refill:  0    Order Specific Question:   Supervising Provider    Answer:   Gae Dry [[id number]]             GYN counsel breast self exam, mammography screening, family planning choices, adequate intake of calcium and vitamin D, diet and exercise     F/U  Return in about 1 year (around 04/09/2019).  Jacqueline Gary B. Raymondo Garcialopez, Jacqueline Montoya 04/08/2018 11:00 AM

## 2018-04-08 NOTE — Patient Instructions (Signed)
I value your feedback and entrusting us with your care. If you get a [facility] patient survey, I would appreciate you taking the time to let us know about your experience today. Thank you! 

## 2018-04-26 ENCOUNTER — Ambulatory Visit
Admission: RE | Admit: 2018-04-26 | Discharge: 2018-04-26 | Disposition: A | Discharge status: Home / Self Care | Payer: BC Managed Care – PPO | Source: Ambulatory Visit | Attending: Obstetrics and Gynecology | Admitting: Obstetrics and Gynecology

## 2018-04-26 ENCOUNTER — Encounter: Payer: Self-pay | Admitting: Obstetrics and Gynecology

## 2018-04-26 DIAGNOSIS — Z1231 Encounter for screening mammogram for malignant neoplasm of breast: Secondary | ICD-10-CM | POA: Diagnosis present

## 2018-04-26 DIAGNOSIS — Z1239 Encounter for other screening for malignant neoplasm of breast: Secondary | ICD-10-CM

## 2018-04-29 ENCOUNTER — Encounter: Payer: Self-pay | Admitting: Obstetrics and Gynecology

## 2018-04-29 ENCOUNTER — Telehealth: Payer: Self-pay | Admitting: Obstetrics and Gynecology

## 2018-04-29 NOTE — Telephone Encounter (Signed)
Patient is schedule Wednesday, 05/05/18 with Jaci for Mirena insertion due to [facility] out of office.

## 2018-04-29 NOTE — Telephone Encounter (Signed)
Ok

## 2018-04-30 ENCOUNTER — Other Ambulatory Visit: Payer: Self-pay | Admitting: Obstetrics and Gynecology

## 2018-04-30 DIAGNOSIS — Z3041 Encounter for surveillance of contraceptive pills: Secondary | ICD-10-CM

## 2018-04-30 DIAGNOSIS — N946 Dysmenorrhea, unspecified: Secondary | ICD-10-CM

## 2018-05-04 NOTE — Telephone Encounter (Signed)
Noted  

## 2018-05-05 ENCOUNTER — Other Ambulatory Visit: Payer: Self-pay | Admitting: Obstetrics and Gynecology

## 2018-05-05 ENCOUNTER — Ambulatory Visit (INDEPENDENT_AMBULATORY_CARE_PROVIDER_SITE_OTHER): Payer: BC Managed Care – PPO | Admitting: Maternal Newborn

## 2018-05-05 ENCOUNTER — Encounter: Payer: Self-pay | Admitting: Maternal Newborn

## 2018-05-05 VITALS — BP 110/76 | HR 66 | Ht 62.0 in | Wt 181.5 lb

## 2018-05-05 DIAGNOSIS — Z30430 Encounter for insertion of intrauterine contraceptive device: Secondary | ICD-10-CM | POA: Diagnosis not present

## 2018-05-05 DIAGNOSIS — E039 Hypothyroidism, unspecified: Secondary | ICD-10-CM

## 2018-05-05 NOTE — Telephone Encounter (Signed)
Please advise. Thank you

## 2018-05-05 NOTE — Progress Notes (Signed)
  GYNECOLOGY OFFICE PROCEDURE NOTE  Jacqueline Montoya is a 42 y.o. G2P1011 here for a Mirena IUD insertion. No GYN concerns.  Last pap smear was on 03/25/2017 and was normal.  The patient is currently using POP for contraception and her last menstrual period was 05/04/2018 (exact date).  The indication for her IUD is contraception/cycle control.  IUD Insertion Procedure Note Patient identified, informed consent performed, consent signed.   Discussed risks of irregular bleeding, cramping, infection, malpositioning, expulsion or uterine perforation of the IUD (1:1000 placements)  which may require further procedure such as laparoscopy. Time out was performed.    Speculum placed in the vagina.  Cervix visualized.  Cleaned with Betadine x 2.  Grasped anteriorly with a single tooth tenaculum.  Uterus sounded to 7 cm. IUD placed per manufacturer's recommendations.  Strings trimmed to 3 cm. Tenaculum was removed, good hemostasis noted.  Patient tolerated procedure well.   Patient was given post-procedure instructions.  Patient was also asked to check IUD strings periodically and follow up for IUD check.  Avel Sensor, CNM

## 2018-05-26 NOTE — Telephone Encounter (Signed)
Mirena rcvd/charged 05/05/18

## 2018-06-02 ENCOUNTER — Encounter: Payer: Self-pay | Admitting: Obstetrics and Gynecology

## 2018-06-03 ENCOUNTER — Encounter: Payer: Self-pay | Admitting: Obstetrics and Gynecology

## 2018-06-04 ENCOUNTER — Encounter: Payer: Self-pay | Admitting: Obstetrics and Gynecology

## 2018-06-09 ENCOUNTER — Encounter: Payer: Self-pay | Admitting: Obstetrics and Gynecology

## 2018-06-09 ENCOUNTER — Ambulatory Visit (INDEPENDENT_AMBULATORY_CARE_PROVIDER_SITE_OTHER): Payer: BC Managed Care – PPO | Admitting: Obstetrics and Gynecology

## 2018-06-09 VITALS — BP 108/78 | HR 83 | Ht 62.0 in | Wt 182.0 lb

## 2018-06-09 DIAGNOSIS — Z3041 Encounter for surveillance of contraceptive pills: Secondary | ICD-10-CM

## 2018-06-09 DIAGNOSIS — Z30432 Encounter for removal of intrauterine contraceptive device: Secondary | ICD-10-CM

## 2018-06-09 DIAGNOSIS — T839XXA Unspecified complication of genitourinary prosthetic device, implant and graft, initial encounter: Secondary | ICD-10-CM | POA: Diagnosis not present

## 2018-06-09 MED ORDER — NORETHINDRONE 0.35 MG PO TABS: 1.0000 | ORAL_TABLET | Freq: Every day | ORAL | 3 refills | Status: DC

## 2018-06-09 NOTE — Patient Instructions (Signed)
I value your feedback and entrusting us with your care. If you get a [place] patient survey, I would appreciate you taking the time to let us know about your experience today. Thank you! 

## 2018-06-09 NOTE — Progress Notes (Signed)
   Chief Complaint  Patient presents with  . IUD check    pt states she can feel the IUD when she was on her period     History of Present Illness:  Jacqueline Montoya is a 42 y.o. that had a Mirena IUD placed approximately 1 month ago. Since that time, she denies pelvic pain, non-menstrual bleeding, vaginal d/c, heavy bleeding. She could feel "something vaginally", had vaginal discomfort during period, but no sx since. No vag d/c, irritation, odor. No sex activity since IUD placed. Was put on POPs 7/19 due to HTN and OCPs, but pt never started them. Wanted IUD instead.   Review of Systems  Constitutional: Negative for fever.  Gastrointestinal: Negative for blood in stool, constipation, diarrhea, nausea and vomiting.  Genitourinary: Positive for vaginal pain. Negative for dyspareunia, dysuria, flank pain, frequency, hematuria, urgency, vaginal bleeding and vaginal discharge.  Musculoskeletal: Negative for back pain.  Skin: Negative for rash.    Physical Exam:  BP 108/78   Pulse 83   Ht 5\' 2"  (1.575 m)   Wt 182 lb (82.6 kg)   LMP 05/28/2018 (Approximate)   BMI 33.29 kg/m  Body mass index is 33.29 kg/m.  Pelvic exam:  Intact IUD present in vaginal canal. Removed with ring forceps. EGBUS, vaginal vault and cervix: within normal limits   Assessment:   Complication of intrauterine device (IUD), unspecified complication, initial encounter ([place]) - IUD spontaneously expelled. Removed with forceps.  Encounter for surveillance of contraceptive pills - Pt to start POPs today. Condoms for 1 wk. Declines replacement IUD. F/u prn.  - Plan: norethindrone (MICRONOR,CAMILA,ERRIN) 0.35 MG tablet  Meds ordered this encounter  Medications  . norethindrone (MICRONOR,CAMILA,ERRIN) 0.35 MG tablet    Sig: Take 1 tablet (0.35 mg total) by mouth daily.    Dispense:  84 tablet    Refill:  3    Order Specific Question:   Supervising Provider    Answer:   Gae Dry [[id number]]     Plan: F/u if  decides on another IUD.  Miron Marxen B. [place] Gamero, PA-C 06/09/2018 4:20 PM

## 2018-07-01 ENCOUNTER — Other Ambulatory Visit: Payer: Self-pay | Admitting: Obstetrics and Gynecology

## 2018-07-01 DIAGNOSIS — E039 Hypothyroidism, unspecified: Secondary | ICD-10-CM

## 2018-07-13 ENCOUNTER — Other Ambulatory Visit: Payer: Self-pay | Admitting: Obstetrics and Gynecology

## 2018-07-13 DIAGNOSIS — E039 Hypothyroidism, unspecified: Secondary | ICD-10-CM

## 2018-07-13 NOTE — Telephone Encounter (Signed)
Please advise 

## 2018-07-30 ENCOUNTER — Encounter: Payer: Self-pay | Admitting: Obstetrics and Gynecology

## 2018-09-29 DIAGNOSIS — F32A Depression, unspecified: Secondary | ICD-10-CM

## 2018-09-29 DIAGNOSIS — F419 Anxiety disorder, unspecified: Secondary | ICD-10-CM

## 2018-09-29 HISTORY — DX: Anxiety disorder, unspecified: F41.9

## 2018-09-29 HISTORY — DX: Depression, unspecified: F32.A

## 2018-10-11 ENCOUNTER — Encounter: Payer: Self-pay | Admitting: Obstetrics and Gynecology

## 2018-10-11 ENCOUNTER — Other Ambulatory Visit: Payer: Self-pay | Admitting: Obstetrics and Gynecology

## 2018-10-11 DIAGNOSIS — F419 Anxiety disorder, unspecified: Secondary | ICD-10-CM

## 2018-10-11 MED ORDER — LORAZEPAM 0.5 MG PO TABS: 0.5000 mg | ORAL_TABLET | Freq: Three times a day (TID) | ORAL | 0 refills | Status: DC | PRN

## 2018-10-11 NOTE — Progress Notes (Signed)
Rx RF lorazepam. Pt feels she needs something daily and will sched appt.

## 2018-10-12 ENCOUNTER — Other Ambulatory Visit: Payer: Self-pay | Admitting: Obstetrics and Gynecology

## 2018-10-12 DIAGNOSIS — E039 Hypothyroidism, unspecified: Secondary | ICD-10-CM

## 2018-10-12 NOTE — Telephone Encounter (Signed)
Please advise 

## 2019-04-09 ENCOUNTER — Other Ambulatory Visit: Payer: Self-pay | Admitting: Obstetrics and Gynecology

## 2019-04-09 DIAGNOSIS — E039 Hypothyroidism, unspecified: Secondary | ICD-10-CM

## 2019-04-26 ENCOUNTER — Ambulatory Visit: Payer: BC Managed Care – PPO | Admitting: Obstetrics and Gynecology

## 2019-05-02 ENCOUNTER — Ambulatory Visit: Payer: BC Managed Care – PPO | Admitting: Obstetrics and Gynecology

## 2019-05-23 ENCOUNTER — Encounter: Payer: Self-pay | Admitting: Obstetrics and Gynecology

## 2019-05-23 ENCOUNTER — Other Ambulatory Visit: Payer: Self-pay

## 2019-05-23 ENCOUNTER — Ambulatory Visit (INDEPENDENT_AMBULATORY_CARE_PROVIDER_SITE_OTHER): Payer: BC Managed Care – PPO | Admitting: Obstetrics and Gynecology

## 2019-05-23 VITALS — BP 110/84 | Ht 62.0 in | Wt 187.6 lb

## 2019-05-23 DIAGNOSIS — Z01419 Encounter for gynecological examination (general) (routine) without abnormal findings: Secondary | ICD-10-CM

## 2019-05-23 DIAGNOSIS — Z1239 Encounter for other screening for malignant neoplasm of breast: Secondary | ICD-10-CM

## 2019-05-23 DIAGNOSIS — F419 Anxiety disorder, unspecified: Secondary | ICD-10-CM

## 2019-05-23 DIAGNOSIS — N946 Dysmenorrhea, unspecified: Secondary | ICD-10-CM

## 2019-05-23 MED ORDER — NAPROXEN SODIUM 550 MG PO TABS: 550.0000 mg | ORAL_TABLET | Freq: Two times a day (BID) | ORAL | 1 refills | Status: DC

## 2019-05-23 NOTE — Patient Instructions (Addendum)
I value your feedback and entrusting us with your care. If you get a [place] patient survey, I would appreciate you taking the time to let us know about your experience today. Thank you!  [facility]: [phone]    

## 2019-05-23 NOTE — Progress Notes (Signed)
Chief Complaint  Patient presents with  . Gynecologic Exam   Depression, thyroid labs?  HPI:      [name] is a 43 y.o. G2P1011 who LMP was Patient's last menstrual period was 05/07/2019 (approximate)., presents today for her annual examination.  Her menses are regular every 28-30 days, lasting 4-5 days, mod to heavy flow, with clots. Dysmenorrhea moderate to severe, occurring first 1-2 days of flow. Her flow and dysmen sx are worse since stopping OCPs last yr due to HTN. Anaprox/heating pad don't relieve sx. She does not have intermenstrual bleeding.   Sex activity: single partner, contraception - none. Changed from OCPs last yr due to new dx HTN. Tried Mirena 8/19 but it spontaneously expelled a month later. Pt tried POPs but didn't like them (can't remember reason).  Last Pap: 03/25/17  Results were: no abnormalities /neg HPV DNA  Hx of STDs: none  Last mammogram: 04/26/18  Results were: normal--routine follow-up in 12 months There is no FH of breast cancer. There is no FH of ovarian cancer. The patient does do self-breast exams.  Tobacco use: The patient denies current or previous tobacco use. Alcohol use: none Drug use: none Exercise: moderately active  She does get adequate calcium but not Vitamin D in her diet.  She takes levothyroxine for hypothyroidism. Being followed by PCP now, as well as for HTN.   She was taking lorazepam occas prn anxiety last yr but had increased sx and now on lexapro with sx improvement, followed by PCP. Doesn't need lorazepam RF currently.   Past Medical History:  Diagnosis Date  . Abnormal Pap smear of cervix   . Anxiety 2020  . Asthma    seasonal  . Depression 2020  . Elevated TSH   . Hemorrhoid   . Hemorrhoids   . Hyperlipidemia   . Hypertension   . Kidney stone 2001  . Spontaneous abortion     Past Surgical History:  Procedure Laterality Date  . ANAL FISSURECTOMY  2014  . ANAL SPHINCTEROTOMY  2014  . CESAREAN  SECTION    . leap      Family History  Problem Relation Age of Onset  . Diabetes Father   . Hypothyroidism Mother   . Uterine cancer Maternal Aunt 60       has contact  . Breast cancer Neg Hx     Social History   Socioeconomic History  . Marital status: Married    Spouse name: Not on file  . Number of children: 1  . Years of education: 22  . Highest education level: Not on file  Occupational History  . Occupation: Pharmacist, hospital    Comment: [employer]  Social Needs  . Financial resource strain: Not on file  . Food insecurity    Worry: Not on file    Inability: Not on file  . Transportation needs    Medical: Not on file    Non-medical: Not on file  Tobacco Use  . Smoking status: Former Smoker    Quit date: 09/29/2004    Years since quitting: 14.6  . Smokeless tobacco: Never Used  Substance and Sexual Activity  . Alcohol use: Yes    Comment: occasionally  . Drug use: No  . Sexual activity: Yes    Birth control/protection: None  Lifestyle  . Physical activity    Days per week: Not on file    Minutes per session: Not on file  . Stress: Not on file  Relationships  . Social Herbalist on phone: Not on file    Gets together: Not on file    Attends religious service: Not on file    Active member of club or organization: Not on file    Attends meetings of clubs or organizations: Not on file    Relationship status: Not on file  . Intimate partner violence    Fear of current or ex partner: Not on file    Emotionally abused: Not on file    Physically abused: Not on file    Forced sexual activity: Not on file  Other Topics Concern  . Not on file  Social History Narrative  . Not on file    Current Outpatient Medications on File Prior to Visit  Medication Sig Dispense Refill  . cetirizine (ZYRTEC) 10 MG tablet Take 10 mg by mouth daily.    Marland Kitchen escitalopram (LEXAPRO) 20 MG tablet     . levothyroxine (SYNTHROID) 25 MCG tablet TAKE 1 1/2 TABLETS BY MOUTH EVERY  DAY EXCEPT 2 TABLETS ON WEDNESDAY AND SUNDAY 150 tablet 0  . LORazepam (ATIVAN) 0.5 MG tablet Take 1 tablet (0.5 mg total) by mouth every 8 (eight) hours as needed for anxiety. 30 tablet 0  . losartan (COZAAR) 25 MG tablet   3   No current facility-administered medications on file prior to visit.       ROS:  Review of Systems  Constitutional: Negative for fatigue, fever and unexpected weight change.  Respiratory: Negative for cough, shortness of breath and wheezing.   Cardiovascular: Negative for chest pain, palpitations and leg swelling.  Gastrointestinal: Negative for blood in stool, constipation, diarrhea, nausea and vomiting.  Endocrine: Negative for cold intolerance, heat intolerance and polyuria.  Genitourinary: Negative for dyspareunia, dysuria, flank pain, frequency, genital sores, hematuria, menstrual problem, pelvic pain, urgency, vaginal bleeding, vaginal discharge and vaginal pain.  Musculoskeletal: Negative for back pain, joint swelling and myalgias.  Skin: Negative for rash.  Neurological: Negative for dizziness, syncope, light-headedness, numbness and headaches.  Hematological: Negative for adenopathy.  Psychiatric/Behavioral: Negative for agitation, confusion, sleep disturbance and suicidal ideas. The patient is not nervous/anxious.     Objective: BP 110/84   Ht 5\' 2"  (1.575 m)   Wt 187 lb 9.6 oz (85.1 kg)   LMP 05/07/2019 (Approximate)   BMI 34.31 kg/m    Physical Exam Constitutional:      Appearance: She is well-developed.  Genitourinary:     Vulva, vagina, uterus, right adnexa and left adnexa normal.     No vulval lesion or tenderness noted.     No vaginal discharge, erythema or tenderness.     No cervical motion tenderness or polyp.     Uterus is not enlarged or tender.     No right or left adnexal mass present.     Right adnexa not tender.     Left adnexa not tender.  Neck:     Musculoskeletal: Normal range of motion.     Thyroid: No thyromegaly.   Cardiovascular:     Rate and Rhythm: Normal rate and regular rhythm.     Heart sounds: Normal heart sounds. No murmur.  Pulmonary:     Effort: Pulmonary effort is normal.     Breath sounds: Normal breath sounds.  Chest:     Breasts:        Right: No mass, nipple discharge, skin change or tenderness.        Left: No mass, nipple discharge,  skin change or tenderness.  Abdominal:     Palpations: Abdomen is soft.     Tenderness: There is no abdominal tenderness. There is no guarding.  Musculoskeletal: Normal range of motion.  Neurological:     General: No focal deficit present.     Mental Status: She is alert and oriented to person, place, and time.     Cranial Nerves: No cranial nerve deficit.  Skin:    General: Skin is warm and dry.  Psychiatric:        Mood and Affect: Mood normal.        Behavior: Behavior normal.        Thought Content: Thought content normal.        Judgment: Judgment normal.  Vitals signs reviewed.     Assessment/Plan: Encounter for annual routine gynecological examination  Screening for breast cancer - Plan: MM 3D SCREEN BREAST BILATERAL--Pt to sched mammo  Dysmenorrhea--Increased off BC. Discussed prog only options. Pt to consider and f/u prn. Can try aleve 2-3 days before sx. Rx RF anaprox. F/u prn.   Anxiety--Improved with lexapro, seeing PCP and therapist.   Meds ordered this encounter  Medications  . naproxen sodium (ANAPROX DS) 550 MG tablet    Sig: Take 1 tablet (550 mg total) by mouth 2 (two) times daily with a meal.    Dispense:  30 tablet    Refill:  1    Order Specific Question:   Supervising Provider    Answer:   Gae Dry [id number]             GYN counsel breast self exam, mammography screening, family planning choices, adequate intake of calcium and vitamin D, diet and exercise     F/U  Return in about 1 year (around 05/22/2020).  Mitsy Owen B. Shayden Gingrich, PA-C 05/23/2019 4:50 PM

## 2019-07-19 ENCOUNTER — Other Ambulatory Visit: Payer: Self-pay | Admitting: Obstetrics and Gynecology

## 2019-07-19 DIAGNOSIS — N946 Dysmenorrhea, unspecified: Secondary | ICD-10-CM

## 2019-08-30 ENCOUNTER — Encounter: Payer: Self-pay | Admitting: Obstetrics and Gynecology

## 2020-01-23 ENCOUNTER — Other Ambulatory Visit: Payer: Self-pay | Admitting: Obstetrics and Gynecology

## 2020-01-23 DIAGNOSIS — F419 Anxiety disorder, unspecified: Secondary | ICD-10-CM

## 2020-01-23 NOTE — Telephone Encounter (Signed)
Please advise 

## 2020-02-29 ENCOUNTER — Encounter: Payer: Self-pay | Admitting: Obstetrics and Gynecology

## 2020-02-29 DIAGNOSIS — E039 Hypothyroidism, unspecified: Secondary | ICD-10-CM

## 2020-03-02 ENCOUNTER — Other Ambulatory Visit: Payer: Self-pay

## 2020-03-02 ENCOUNTER — Other Ambulatory Visit: Payer: BC Managed Care – PPO

## 2020-03-02 DIAGNOSIS — E039 Hypothyroidism, unspecified: Secondary | ICD-10-CM

## 2020-03-03 LAB — TSH+FREE T4
Free T4: 0.87 ng/dL (ref 0.82–1.77)
TSH: 3.12 u[IU]/mL (ref 0.450–4.500)

## 2020-03-05 ENCOUNTER — Other Ambulatory Visit: Payer: Self-pay

## 2020-03-05 ENCOUNTER — Ambulatory Visit (INDEPENDENT_AMBULATORY_CARE_PROVIDER_SITE_OTHER): Payer: BC Managed Care – PPO

## 2020-03-05 ENCOUNTER — Telehealth: Payer: Self-pay | Admitting: Obstetrics and Gynecology

## 2020-03-05 DIAGNOSIS — N939 Abnormal uterine and vaginal bleeding, unspecified: Secondary | ICD-10-CM | POA: Diagnosis not present

## 2020-03-05 NOTE — Telephone Encounter (Signed)
Check GYN u/s for AUB. Neg thyroid labs

## 2020-03-06 ENCOUNTER — Ambulatory Visit (INDEPENDENT_AMBULATORY_CARE_PROVIDER_SITE_OTHER): Payer: BC Managed Care – PPO | Admitting: Obstetrics and Gynecology

## 2020-03-06 ENCOUNTER — Other Ambulatory Visit (HOSPITAL_COMMUNITY)
Admission: RE | Admit: 2020-03-06 | Discharge: 2020-03-06 | Disposition: A | Discharge status: Home / Self Care | Payer: BC Managed Care – PPO | Source: Ambulatory Visit | Attending: Obstetrics and Gynecology | Admitting: Obstetrics and Gynecology

## 2020-03-06 ENCOUNTER — Encounter: Payer: Self-pay | Admitting: Obstetrics and Gynecology

## 2020-03-06 VITALS — BP 128/80 | Ht 62.0 in | Wt 186.0 lb

## 2020-03-06 DIAGNOSIS — N939 Abnormal uterine and vaginal bleeding, unspecified: Secondary | ICD-10-CM | POA: Insufficient documentation

## 2020-03-06 DIAGNOSIS — R9389 Abnormal findings on diagnostic imaging of other specified body structures: Secondary | ICD-10-CM

## 2020-03-06 MED ORDER — NORETHINDRONE ACETATE 5 MG PO TABS: 5.0000 mg | ORAL_TABLET | Freq: Every day | ORAL | 0 refills | Status: DC

## 2020-03-06 NOTE — Progress Notes (Signed)
[place] Pink, MD   Chief Complaint  Patient presents with  . Follow-up    AUB/EMB    HPI:      [name] is a 44 y.o. G2P1011 whose LMP was No LMP recorded., presents today for EMB due to persisting AUB and thickened endometrium on u/s. Pt started regular menses about 3 wks ago on time but has continued to bleed heavily, changing pads hourly and with large clots. No dysmen. Had normal menses April but it was lighter than usual. Periods usually monthly, lasting 3 days, mod to heavy flow, with mild dysmen. Had neg thyroid labs last wk (hx of hypothyroidism). Had GYN u/s yesterday which showed EM=15.3 mm.  She is rarely sex active.   Did OCPs in past but stopped due to hypertension. Didn't like POPs. Had Mirena 8/19 for menometrorrhagia but it spontaneously expelled and she didn't want a replacement.   Neg pap/neg HPV DNA  6/18. Annual due 8/21.  ULTRASOUND REPORT  Location: [facility]  Date of Service: 03/05/2020   Indications:Abnormal Uterine Bleeding   Findings:  The uterus is anteverted and measures 10.3 x 5.2 x 4.4 cm. Echo texture is homogenous without evidence of focal masses.  The Endometrium measures 15.3 mm.   Right Ovary measures 2.7 x 2.2 x 1.6 cm. It is normal in appearance. There are two dominant follicles in the right ovary measuring up to 1.5 cm.   Left Ovary measures 2.5 x  1.2 x 1.7 cm. It is normal in appearance.  Survey of the adnexa demonstrates no adnexal masses.  There is no free fluid in the cul de sac.  Impression: 1. The endometrium is thick and heterogeneous.  2. Normal appearing ovaries.   Gweneth Dimitri, RT   The ultrasound images and findings were reviewed by me and I agree with the above report.  Prentice Docker, MD, [facility], [facility] 03/05/2020 5:45 PM      Past Medical History:  Diagnosis Date  . Abnormal Pap smear of cervix   . Anxiety 2020  . Asthma    seasonal  .  Depression 2020  . Elevated TSH   . Hemorrhoid   . Hemorrhoids   . Hyperlipidemia   . Hypertension   . Kidney stone 2001  . Spontaneous abortion     Past Surgical History:  Procedure Laterality Date  . ANAL FISSURECTOMY  2014  . ANAL SPHINCTEROTOMY  2014  . CESAREAN SECTION    . leap      Family History  Problem Relation Age of Onset  . Diabetes Father   . Hypothyroidism Mother   . Uterine cancer Maternal Aunt 60       has contact  . Breast cancer Neg Hx     Social History   Socioeconomic History  . Marital status: Married    Spouse name: Not on file  . Number of children: 1  . Years of education: 29  . Highest education level: Not on file  Occupational History  . Occupation: Pharmacist, hospital    Comment: [employer]  Tobacco Use  . Smoking status: Former Smoker    Quit date: 09/29/2004    Years since quitting: 15.4  . Smokeless tobacco: Never Used  Substance and Sexual Activity  . Alcohol use: Yes    Comment: occasionally  . Drug use: No  . Sexual activity: Yes    Birth control/protection: None  Other Topics Concern  . Not on file  Social History Narrative  . Not on file   Social Determinants of Health   Financial Resource Strain:   . Difficulty of Paying Living Expenses:   Food Insecurity:   . Worried About Charity fundraiser in the Last Year:   . Arboriculturist in the Last Year:   Transportation Needs:   . Film/video editor (Medical):   Marland Kitchen Lack of Transportation (Non-Medical):   Physical Activity:   . Days of Exercise per Week:   . Minutes of Exercise per Session:   Stress:   . Feeling of Stress :   Social Connections:   . Frequency of Communication with Friends and Family:   . Frequency of Social Gatherings with Friends and Family:   . Attends Religious Services:   . Active Member of Clubs or Organizations:   . Attends Archivist Meetings:   Marland Kitchen Marital Status:   Intimate Partner Violence:   . Fear of Current or Ex-Partner:   .  Emotionally Abused:   Marland Kitchen Physically Abused:   . Sexually Abused:     Outpatient Medications Prior to Visit  Medication Sig Dispense Refill  . cetirizine (ZYRTEC) 10 MG tablet Take 10 mg by mouth daily.    Marland Kitchen escitalopram (LEXAPRO) 20 MG tablet     . levothyroxine (SYNTHROID) 50 MCG tablet Take by mouth.    Marland Kitchen LORazepam (ATIVAN) 0.5 MG tablet TAKE 1 TABLET(0.5 MG) BY MOUTH EVERY 8 HOURS AS NEEDED FOR ANXIETY 30 tablet 0  . losartan (COZAAR) 25 MG tablet   3  . naproxen sodium (ANAPROX DS) 550 MG tablet Take 1 tablet (550 mg total) by mouth 2 (two) times daily with a meal. 30 tablet 1  . levothyroxine (SYNTHROID) 25 MCG tablet TAKE 1 1/2 TABLETS BY MOUTH EVERY DAY EXCEPT 2 TABLETS ON WEDNESDAY AND SUNDAY 150 tablet 0   No facility-administered medications prior to visit.      ROS:  Review of Systems  Constitutional: Negative for fever.  Gastrointestinal: Negative for blood in stool, constipation, diarrhea, nausea and vomiting.  Genitourinary: Positive for menstrual problem. Negative for dyspareunia, dysuria, flank pain, frequency, hematuria, urgency, vaginal bleeding, vaginal discharge and vaginal pain.  Musculoskeletal: Negative for back pain.  Skin: Negative for rash.  BREAST: No symptoms   OBJECTIVE:   Vitals:  BP 128/80   Ht 5\' 2"  (1.575 m)   Wt 186 lb (84.4 kg)   BMI 34.02 kg/m   Physical Exam Vitals reviewed.  Constitutional:      Appearance: She is well-developed.  Pulmonary:     Effort: Pulmonary effort is normal.  Genitourinary:    General: Normal vulva.     Pubic Area: No rash.      Labia:        Right: No rash, tenderness or lesion.        Left: No rash, tenderness or lesion.      Vagina: Bleeding present. No vaginal discharge, erythema or tenderness.     Cervix: Normal.     Uterus: Normal. Not enlarged and not tender.      Adnexa: Right adnexa normal and left adnexa normal.       Right: No mass or tenderness.         Left: No mass or tenderness.      Musculoskeletal:        General: Normal range of motion.     Cervical back: Normal range of motion.  Skin:    General: Skin  is warm and dry.  Neurological:     General: No focal deficit present.     Mental Status: She is alert and oriented to person, place, and time.  Psychiatric:        Mood and Affect: Mood normal.        Behavior: Behavior normal.        Thought Content: Thought content normal.        Judgment: Judgment normal.     Endometrial Biopsy After discussion with the patient regarding her abnormal uterine bleeding I recommended that she proceed with an endometrial biopsy for further diagnosis. The risks, benefits, alternatives, and indications for an endometrial biopsy were discussed with the patient in detail. She understood the risks including infection, bleeding, cervical laceration and uterine perforation.  Verbal consent was obtained.   PROCEDURE NOTE:  [place] endometrial biopsy was performed using aseptic technique with iodine preparation.  The uterus was sounded to a length of 7.0 cm.  Adequate sampling was obtained with minimal blood loss.  The patient tolerated the procedure well.  Disposition will be pending pathology.   Assessment/Plan:  Abnormal uterine bleeding (AUB) - Plan: Surgical pathology, norethindrone (AYGESTIN) 5 MG tablet; Normal thyroid, thickened EM. EMB today, will call with results. Start aygestin for 10 days. F/u with bleeding. May need BC if EMB neg and sx recur. F/u prn.   Thickened endometrium  Meds ordered this encounter  Medications  . norethindrone (AYGESTIN) 5 MG tablet    Sig: Take 1 tablet (5 mg total) by mouth daily for 10 days.    Dispense:  10 tablet    Refill:  0    Order Specific Question:   Supervising Provider    Answer:   Gae Dry [[id number]]      Return if symptoms worsen or fail to improve./annual due 6/83  Reverie Vaquera B. Dewane Timson, PA-C 03/06/2020 4:00 PM

## 2020-03-06 NOTE — Patient Instructions (Signed)
I value your feedback and entrusting us with your care. If you get a [place] patient survey, I would appreciate you taking the time to let us know about your experience today. Thank you!  As of September 08, 2019, your lab results will be released to your MyChart immediately, before I even have a chance to see them. Please give me time to review them and contact you if there are any abnormalities. Thank you for your patience.  

## 2020-03-08 LAB — SURGICAL PATHOLOGY

## 2020-03-20 ENCOUNTER — Encounter: Payer: Self-pay | Admitting: Obstetrics and Gynecology

## 2020-05-23 ENCOUNTER — Ambulatory Visit (INDEPENDENT_AMBULATORY_CARE_PROVIDER_SITE_OTHER): Payer: BC Managed Care – PPO | Admitting: Obstetrics and Gynecology

## 2020-05-23 ENCOUNTER — Other Ambulatory Visit: Payer: Self-pay

## 2020-05-23 ENCOUNTER — Encounter: Payer: Self-pay | Admitting: Obstetrics and Gynecology

## 2020-05-23 VITALS — BP 120/80 | Ht 62.0 in | Wt 183.0 lb

## 2020-05-23 DIAGNOSIS — Z1231 Encounter for screening mammogram for malignant neoplasm of breast: Secondary | ICD-10-CM

## 2020-05-23 DIAGNOSIS — Z01419 Encounter for gynecological examination (general) (routine) without abnormal findings: Secondary | ICD-10-CM

## 2020-05-23 DIAGNOSIS — N920 Excessive and frequent menstruation with regular cycle: Secondary | ICD-10-CM | POA: Diagnosis not present

## 2020-05-23 LAB — POCT HEMOGLOBIN: Hemoglobin: 10.8 g/dL — AB (ref 11–14.6)

## 2020-05-23 NOTE — Patient Instructions (Addendum)
I value your feedback and entrusting us with your care. If you get a [place] patient survey, I would appreciate you taking the time to let us know about your experience today. Thank you! ° °As of September 08, 2019, your lab results will be released to your MyChart immediately, before I even have a chance to see them. Please give me time to review them and contact you if there are any abnormalities. Thank you for your patience.  ° °[facility]: [phone] ° ° ° °

## 2020-05-23 NOTE — Progress Notes (Signed)
Chief Complaint  Patient presents with  . Gynecologic Exam    HPI:      [name] is a 44 y.o. G2P1011 who LMP was Patient's last menstrual period was 05/13/2020 (approximate)., presents today for her annual examination.  Her menses are regular every 28-30 days, lasting 4-5 days, heavy flow, with clots. Dysmenorrhea none. No BTB. Hx of AUB and menorrhagia earlier this yr.  Neg EMB 6/21 after thickened EM on u/s. Did POPs in past but didn't like them, tried [place] 8/19 but it spontaneously expelled a month later. Didn't have any relief with lysteda. Pt interested in hyst.   Sex activity: single partner, contraception - none.  Last Pap: 03/25/17  Results were: no abnormalities /neg HPV DNA  Hx of STDs: none  Last mammogram: 04/26/18  Results were: normal--routine follow-up in 12 months There is no FH of breast cancer. There is no FH of ovarian cancer. The patient does do self-breast exams.  Tobacco use: The patient denies current or previous tobacco use. Alcohol use: none Drug use: none Exercise: moderately active  She does not get adequate calcium or Vitamin D in her diet.  She takes levothyroxine for hypothyroidism. Being followed by PCP now, as well as for HTN. She was taking lorazepam occas prn anxiety last yr but had increased sx and now on lexapro with sx improvement, followed by PCP.   Past Medical History:  Diagnosis Date  . Abnormal Pap smear of cervix   . Anxiety 2020  . Asthma    seasonal  . Depression 2020  . Elevated TSH   . Hemorrhoid   . Hemorrhoids   . Hyperlipidemia   . Hypertension   . Kidney stone 2001  . Spontaneous abortion     Past Surgical History:  Procedure Laterality Date  . ANAL FISSURECTOMY  2014  . ANAL SPHINCTEROTOMY  2014  . CESAREAN SECTION    . leap      Family History  Problem Relation Age of Onset  . Diabetes Father   . Hypothyroidism Mother   . Uterine cancer Maternal Aunt 60       has contact  . Breast cancer Neg  Hx     Social History   Socioeconomic History  . Marital status: Married    Spouse name: Not on file  . Number of children: 1  . Years of education: 44  . Highest education level: Not on file  Occupational History  . Occupation: Pharmacist, hospital    Comment: [employer]  Tobacco Use  . Smoking status: Former Smoker    Quit date: 09/29/2004    Years since quitting: 15.6  . Smokeless tobacco: Never Used  Vaping Use  . Vaping Use: Never used  Substance and Sexual Activity  . Alcohol use: Yes    Comment: occasionally  . Drug use: No  . Sexual activity: Yes    Birth control/protection: None  Other Topics Concern  . Not on file  Social History Narrative  . Not on file   Social Determinants of Health   Financial Resource Strain:   . Difficulty of Paying Living Expenses: Not on file  Food Insecurity:   . Worried About Charity fundraiser in the Last Year: Not on file  . Ran Out of Food in the Last Year: Not on file  Transportation Needs:   . Lack of Transportation (Medical): Not on file  . Lack of Transportation (Non-Medical): Not on file  Physical Activity:   .  Days of Exercise per Week: Not on file  . Minutes of Exercise per Session: Not on file  Stress:   . Feeling of Stress : Not on file  Social Connections:   . Frequency of Communication with Friends and Family: Not on file  . Frequency of Social Gatherings with Friends and Family: Not on file  . Attends Religious Services: Not on file  . Active Member of Clubs or Organizations: Not on file  . Attends Archivist Meetings: Not on file  . Marital Status: Not on file  Intimate Partner Violence:   . Fear of Current or Ex-Partner: Not on file  . Emotionally Abused: Not on file  . Physically Abused: Not on file  . Sexually Abused: Not on file    Current Outpatient Medications on File Prior to Visit  Medication Sig Dispense Refill  . cetirizine (ZYRTEC) 10 MG tablet Take 10 mg by mouth daily.    Marland Kitchen escitalopram  (LEXAPRO) 20 MG tablet     . levothyroxine (SYNTHROID) 50 MCG tablet Take by mouth.    Marland Kitchen LORazepam (ATIVAN) 0.5 MG tablet TAKE 1 TABLET(0.5 MG) BY MOUTH EVERY 8 HOURS AS NEEDED FOR ANXIETY 30 tablet 0  . losartan (COZAAR) 25 MG tablet   3  . naproxen sodium (ANAPROX DS) 550 MG tablet Take 1 tablet (550 mg total) by mouth 2 (two) times daily with a meal. 30 tablet 1   No current facility-administered medications on file prior to visit.      ROS:  Review of Systems  Constitutional: Negative for fatigue, fever and unexpected weight change.  Respiratory: Negative for cough, shortness of breath and wheezing.   Cardiovascular: Negative for chest pain, palpitations and leg swelling.  Gastrointestinal: Negative for blood in stool, constipation, diarrhea, nausea and vomiting.  Endocrine: Negative for cold intolerance, heat intolerance and polyuria.  Genitourinary: Negative for dyspareunia, dysuria, flank pain, frequency, genital sores, hematuria, menstrual problem, pelvic pain, urgency, vaginal bleeding, vaginal discharge and vaginal pain.  Musculoskeletal: Negative for back pain, joint swelling and myalgias.  Skin: Negative for rash.  Neurological: Negative for dizziness, syncope, light-headedness, numbness and headaches.  Hematological: Negative for adenopathy.  Psychiatric/Behavioral: Negative for agitation, confusion, sleep disturbance and suicidal ideas. The patient is not nervous/anxious.     Objective: BP 120/80   Ht 5\' 2"  (1.575 m)   Wt 183 lb (83 kg)   LMP 05/13/2020 (Approximate)   BMI 33.47 kg/m    Physical Exam Constitutional:      Appearance: She is well-developed.  Genitourinary:     Vulva, vagina, uterus, right adnexa and left adnexa normal.     No vulval lesion or tenderness noted.     No vaginal discharge, erythema or tenderness.     No cervical motion tenderness or polyp.     Uterus is not enlarged or tender.     No right or left adnexal mass present.     Right  adnexa not tender.     Left adnexa not tender.  Neck:     Thyroid: No thyromegaly.  Cardiovascular:     Rate and Rhythm: Normal rate and regular rhythm.     Heart sounds: Normal heart sounds. No murmur heard.   Pulmonary:     Effort: Pulmonary effort is normal.     Breath sounds: Normal breath sounds.  Chest:     Breasts:        Right: No mass, nipple discharge, skin change or tenderness.  Left: No mass, nipple discharge, skin change or tenderness.  Abdominal:     Palpations: Abdomen is soft.     Tenderness: There is no abdominal tenderness. There is no guarding.  Musculoskeletal:        General: Normal range of motion.     Cervical back: Normal range of motion.  Neurological:     General: No focal deficit present.     Mental Status: She is alert and oriented to person, place, and time.     Cranial Nerves: No cranial nerve deficit.  Skin:    General: Skin is warm and dry.  Psychiatric:        Mood and Affect: Mood normal.        Behavior: Behavior normal.        Thought Content: Thought content normal.        Judgment: Judgment normal.  Vitals reviewed.   RESULTS: Results for orders placed or performed in visit on 05/23/20 (from the past 24 hour(s))  POCT hemoglobin     Status: Abnormal   Collection Time: 05/23/20  4:44 PM  Result Value Ref Range   Hemoglobin 10.8 (A) 11 - 14.6 g/dL     Assessment/Plan: Encounter for annual routine gynecological examination  Encounter for screening mammogram for malignant neoplasm of breast - Plan: MM 3D SCREEN BREAST BILATERAL; pt to sched mammo  Menorrhagia with regular cycle - Plan: POCT hemoglobin; slightly anemic. Add MVI with iron. Sched appt with MD to discuss hyst or endometrial ablation. Pt declines prog only options, failed lysteda and IUD.           GYN counsel breast self exam, mammography screening, family planning choices, adequate intake of calcium and vitamin D, diet and exercise     F/U  Return in about 1  week (around 05/30/2020) for with MD for hyst consult.  Godric Lavell B. Tanga Gloor, PA-C 05/23/2020 4:52 PM

## 2020-06-08 ENCOUNTER — Ambulatory Visit: Payer: BC Managed Care – PPO | Admitting: Obstetrics & Gynecology

## 2020-06-12 ENCOUNTER — Encounter: Payer: Self-pay | Admitting: Obstetrics & Gynecology

## 2020-06-12 ENCOUNTER — Other Ambulatory Visit: Payer: Self-pay

## 2020-06-12 ENCOUNTER — Ambulatory Visit (INDEPENDENT_AMBULATORY_CARE_PROVIDER_SITE_OTHER): Payer: BC Managed Care – PPO | Admitting: Obstetrics & Gynecology

## 2020-06-12 VITALS — BP 102/70 | Ht 62.0 in | Wt 184.0 lb

## 2020-06-12 DIAGNOSIS — N939 Abnormal uterine and vaginal bleeding, unspecified: Secondary | ICD-10-CM | POA: Diagnosis not present

## 2020-06-12 DIAGNOSIS — N920 Excessive and frequent menstruation with regular cycle: Secondary | ICD-10-CM | POA: Diagnosis not present

## 2020-06-12 NOTE — Progress Notes (Signed)
HPI:      [name] is a 44 y.o. G2P1011 who LMP was Patient's last menstrual period was 05/13/2020 (approximate)., presents today for a problem visit.    Her menses are regular every 28-30 days, lasting4-5days, heavy flow, with clots. Dysmenorrhea none. No BTB. Hx of AUB and menorrhagia earlier this yr.  Neg EMB 6/21 after thickened EM on u/s. Did POPs in past but didn't like them, tried [place] 8/19 but it spontaneously expelled a month later. Didn't have any relief with lysteda. Pt interested in hyst. No sx's of menopause.  No desire for fertility.  [place] reviewed. EMB normal. Prior PAP normal   PMHx: She  has a past medical history of Abnormal Pap smear of cervix, Anxiety (2020), Asthma, Depression (2020), Elevated TSH, Hemorrhoid, Hemorrhoids, Hyperlipidemia, Hypertension, Kidney stone (2001), and Spontaneous abortion. Also,  has a past surgical history that includes leap; Cesarean section; Anal fissurectomy (2014); and Anal sphincterotomy (2014)., family history includes Diabetes in her father; Hypothyroidism in her mother; Uterine cancer (age of onset: 67) in her maternal aunt.,  reports that she quit smoking about 15 years ago. She has never used smokeless tobacco. She reports current alcohol use. She reports that she does not use drugs.  She  Current Outpatient Medications:  .  cetirizine (ZYRTEC) 10 MG tablet, Take 10 mg by mouth daily., Disp: , Rfl:  .  escitalopram (LEXAPRO) 20 MG tablet, , Disp: , Rfl:  .  levothyroxine (SYNTHROID) 50 MCG tablet, Take by mouth., Disp: , Rfl:  .  LORazepam (ATIVAN) 0.5 MG tablet, TAKE 1 TABLET(0.5 MG) BY MOUTH EVERY 8 HOURS AS NEEDED FOR ANXIETY, Disp: 30 tablet, Rfl: 0 .  losartan (COZAAR) 25 MG tablet, , Disp: , Rfl: 3 .  naproxen sodium (ANAPROX DS) 550 MG tablet, Take 1 tablet (550 mg total) by mouth 2 (two) times daily with a meal., Disp: 30 tablet, Rfl: 1  Also, is allergic to clarithromycin.  Review of Systems  Constitutional:  Negative for chills, fever and malaise/fatigue.  HENT: Negative for congestion, sinus pain and sore throat.   Eyes: Negative for blurred vision and pain.  Respiratory: Negative for cough and wheezing.   Cardiovascular: Negative for chest pain and leg swelling.  Gastrointestinal: Negative for abdominal pain, constipation, diarrhea, heartburn, nausea and vomiting.  Genitourinary: Negative for dysuria, frequency, hematuria and urgency.  Musculoskeletal: Negative for back pain, joint pain, myalgias and neck pain.  Skin: Negative for itching and rash.  Neurological: Negative for dizziness, tremors and weakness.  Endo/Heme/Allergies: Does not bruise/bleed easily.  Psychiatric/Behavioral: Negative for depression. The patient is not nervous/anxious and does not have insomnia.     Objective: BP 102/70   Ht 5\' 2"  (1.575 m)   Wt 184 lb (83.5 kg)   LMP 05/13/2020 (Approximate)   BMI 33.65 kg/m  Physical Exam Constitutional:      General: She is not in acute distress.    Appearance: She is well-developed.  Musculoskeletal:        General: Normal range of motion.  Neurological:     Mental Status: She is alert and oriented to person, place, and time.  Skin:    General: Skin is warm and dry.  Vitals reviewed.     ASSESSMENT/PLAN:  dysfunctional uterine bleeding  Problem List Items Addressed This Visit      Genitourinary   Abnormal uterine bleeding (AUB) - Primary     Other   Menorrhagia with regular cycle    Treatment option  for menorrhagia or menometrorrhagia discussed in great detail with the patient.  Options include hormonal therapy, IUD therapy such as Mirena, D&C, Ablation, and Hysterectomy.  The pros and cons of each option discussed with patient.  Pt desires hysterectomy.  Discussed preservation of ovaries, TLH/BS option.  Info gv.  To schedule Nov.  A total of 20 minutes were spent face-to-face with the patient as well as preparation, review, communication, and documentation  during this encounter.    Jacqueline Applebaum, MD, [facility], [facility] 06/12/2020  5:07 PM

## 2020-06-12 NOTE — Patient Instructions (Signed)
Total Laparoscopic Hysterectomy A total laparoscopic hysterectomy is a minimally invasive surgery to remove the uterus and cervix. The fallopian tubes and ovaries can also be removed (bilateral salpingo-oophorectomy) during this surgery, if necessary. This procedure may be done to treat problems such as:  Noncancerous growths in the uterus (uterine fibroids) that cause symptoms.  A condition that causes the lining of the uterus (endometrium) to grow in other areas (endometriosis).  Problems with pelvic support. This is caused by weakened muscles of the pelvis following vaginal childbirth or menopause.  Cancer of the cervix, ovaries, uterus, or endometrium.  Excessive (dysfunctional) uterine bleeding. This surgery is performed by inserting a thin, lighted tube (laparoscope) and surgical instruments into small incisions in the abdomen. The laparoscope sends images to a monitor. The images help the health care provider perform the procedure. After this procedure, you will no longer be able to have a baby, and you will no longer have a menstrual period. Tell a health care provider about:  Any allergies you have.  All medicines you are taking, including vitamins, herbs, eye drops, creams, and over-the-counter medicines.  Any problems you or family members have had with anesthetic medicines.  Any blood disorders you have.  Any surgeries you have had.  Any medical conditions you have.  Whether you are pregnant or may be pregnant. What are the risks? Generally, this is a safe procedure. However, problems may occur, including:  Infection.  Bleeding.  Blood clots in the legs or lungs.  Allergic reactions to medicines.  Damage to other structures or organs.  The risk that the surgery may have to be switched to the regular one in which a large incision is made in the abdomen (abdominal hysterectomy). What happens before the procedure? Staying hydrated Follow instructions from your  health care provider about hydration, which may include:  Up to 2 hours before the procedure - you may continue to drink clear liquids, such as water, clear fruit juice, black coffee, and plain tea Eating and drinking restrictions Follow instructions from your health care provider about eating and drinking, which may include:  8 hours before the procedure - stop eating heavy meals or foods such as meat, fried foods, or fatty foods.  6 hours before the procedure - stop eating light meals or foods, such as toast or cereal.  6 hours before the procedure - stop drinking milk or drinks that contain milk.  2 hours before the procedure - stop drinking clear liquids. Medicines  Ask your health care provider about: ? Changing or stopping your regular medicines. This is especially important if you are taking diabetes medicines or blood thinners. ? Taking over-the-counter medicines, vitamins, herbs, and supplements. ? Taking medicines such as aspirin and ibuprofen. These medicines can thin your blood. Do not take these medicines unless your health care provider tells you to take them.  You may be given antibiotic medicine to help prevent infection.  You may be asked to take laxatives.  You may be given medicines to help prevent nausea and vomiting after the procedure. General instructions  Ask your health care provider how your surgical site will be marked or identified.  You may be asked to shower with a germ-killing soap.  Do not use any products that contain nicotine or tobacco, such as cigarettes and e-cigarettes. If you need help quitting, ask your health care provider.  You may have an exam or testing, such as an ultrasound to determine the size and shape of your pelvic organs.    You may have a blood or urine sample taken.  This procedure can affect the way you feel about yourself. Talk with your health care provider about the physical and emotional changes hysterectomy may  cause.  Plan to have someone take you home from the hospital or clinic.  Plan to have a responsible adult care for you for at least 24 hours after you leave the hospital or clinic. This is important. What happens during the procedure?  To lower your risk of infection: ? Your health care team will wash or sanitize their hands. ? Your skin will be washed with soap. ? Hair may be removed from the surgical area.  An IV will be inserted into one of your veins.  You will be given one or more of the following: ? A medicine to help you relax (sedative). ? A medicine to make you fall asleep (general anesthetic).  You will be given antibiotic medicine through your IV.  A tube may be inserted down your throat to help you breathe during the procedure.  A gas (carbon dioxide) will be used to inflate your abdomen to allow your surgeon to see inside of your abdomen.  Three or four small incisions will be made in your abdomen.  A laparoscope will be inserted into one of your incisions. Surgical instruments will be inserted through the other incisions in order to perform the procedure.  Your uterus and cervix may be removed through your vagina or cut into small pieces and removed through the small incisions. Any other organs that need to be removed will also be removed this way.  Carbon dioxide will be released from inside of your abdomen.  Your incisions will be closed with stitches (sutures).  A bandage (dressing) may be placed over your incisions. The procedure may vary among health care providers and hospitals. What happens after the procedure?  Your blood pressure, heart rate, breathing rate, and blood oxygen level will be monitored until the medicines you were given have worn off.  You will be given medicine for pain and nausea as needed.  Do not drive for 24 hours if you received a sedative. Summary  Total Laparoscopic hysterectomy is a procedure to remove your uterus, cervix and  sometimes the fallopian tubes and ovaries.  This procedure can affect the way you feel about yourself. Talk with your health care provider about the physical and emotional changes hysterectomy may cause.  After this procedure, you will no longer be able to have a baby, and you will no longer have a menstrual period.  You will be given pain medicine to control discomfort after this procedure. This information is not intended to replace advice given to you by your health care provider. Make sure you discuss any questions you have with your health care provider. Document Revised: 08/28/2017 Document Reviewed: 11/26/2016 Elsevier Patient Education  2020 Elsevier Inc.  

## 2020-06-15 ENCOUNTER — Telehealth: Payer: Self-pay | Admitting: Obstetrics & Gynecology

## 2020-06-15 NOTE — Telephone Encounter (Signed)
Left a message for the patient to return the call.  

## 2020-06-18 ENCOUNTER — Telehealth: Payer: Self-pay | Admitting: Obstetrics & Gynecology

## 2020-06-18 NOTE — Telephone Encounter (Signed)
Called patient to schedule TLH/BS w Kaylyn Layer 08/09/20  H&P 11/3 @ 4:30   Covid testing 11/9 @ 8-10:30, [address], drive up and wear mask. Advised pt to quarantine until DOS.  Pre-admit phone call appointment to be requested - date and time will be included on H&P paper work. Also all appointments will be updated on pt MyChart. Explained that this appointment has a call window. Based on the time scheduled will indicate if the call will be received within a 4 hour window before 1:00 or after.  Advised that pt may also receive calls from the hospital pharmacy and pre-service center.  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 No secondary insurance.

## 2020-06-18 NOTE — Telephone Encounter (Signed)
-----   Message from Gae Dry, MD sent at 06/12/2020  5:06 PM EDT ----- Regarding: SURGERY Surgery Booking Request Patient Full Name:  Jacqueline Montoya  MRN: [id number]  DOB: July 11, 1976  Surgeon: Hoyt Koch, MD  Requested Surgery Date and Time: 08/09/20 (or 11/16) Primary Diagnosis AND Code:   ICD-10-CM  1. Abnormal uterine bleeding (AUB)  N93.9  2. Menorrhagia with regular cycle  N92.0  Secondary Diagnosis and Code:  Surgical Procedure: TLH/BS RNFA Requested?: No L&D Notification: No Admission Status: same day surgery Length of Surgery: 75 min Special Case Needs: No H&P: Yes Phone Interview???:  Yes Interpreter: No Medical Clearance:  No Special Scheduling Instructions: No Any known health/anesthesia issues, diabetes, sleep apnea, latex allergy, defibrillator/pacemaker?: No Acuity: P3   (P1 highest, P2 delay may cause harm, P3 low, elective gyn, P4 lowest)

## 2020-08-01 ENCOUNTER — Ambulatory Visit (INDEPENDENT_AMBULATORY_CARE_PROVIDER_SITE_OTHER): Payer: BC Managed Care – PPO | Admitting: Obstetrics & Gynecology

## 2020-08-01 ENCOUNTER — Encounter: Payer: Self-pay | Admitting: Obstetrics & Gynecology

## 2020-08-01 ENCOUNTER — Other Ambulatory Visit: Payer: Self-pay

## 2020-08-01 VITALS — BP 120/80 | Ht 62.0 in | Wt 186.0 lb

## 2020-08-01 DIAGNOSIS — N920 Excessive and frequent menstruation with regular cycle: Secondary | ICD-10-CM

## 2020-08-01 NOTE — H&P (View-Only) (Signed)
PRE-OPERATIVE HISTORY AND PHYSICAL EXAM  HPI:  Jacqueline Montoya is a 44 y.o. G2P1011 No LMP recorded. (Menstrual status: Irregular Periods).; she is being admitted for surgery related to abnormal uterine bleeding and menorrhagia. Her menses are regular every 28-30 days, lasting4-5days, heavy flow, with clots. Dysmenorrheanone. No BTB. Hx of AUB and menorrhagia earlier this yr. Neg EMB 6/21 after thickened EM on u/s. Did POPs in past but didn't like them, tried [place] 8/19 but it spontaneously expelled a month later.Didn't have any relief with lysteda. Pt interested in hyst.No sx's of menopause.  No desire for fertility.  PAP and EMB normal.  PMHx: Past Medical History:  Diagnosis Date  . Abnormal Pap smear of cervix   . Anxiety 2020  . Asthma    seasonal  . Depression 2020  . Elevated TSH   . Hemorrhoid   . Hemorrhoids   . Hyperlipidemia   . Hypertension   . Kidney stone 2001  . Spontaneous abortion    Past Surgical History:  Procedure Laterality Date  . ANAL FISSURECTOMY  2014  . ANAL SPHINCTEROTOMY  2014  . CESAREAN SECTION    . leap     Family History  Problem Relation Age of Onset  . Diabetes Father   . Hypothyroidism Mother   . Uterine cancer Maternal Aunt 60       has contact  . Breast cancer Neg Hx    Social History   Tobacco Use  . Smoking status: Former Smoker    Quit date: 09/29/2004    Years since quitting: 15.8  . Smokeless tobacco: Never Used  Vaping Use  . Vaping Use: Never used  Substance Use Topics  . Alcohol use: Yes    Comment: occasionally  . Drug use: No    Current Outpatient Medications:  .  cetirizine (ZYRTEC) 10 MG tablet, Take 10 mg by mouth daily., Disp: , Rfl:  .  escitalopram (LEXAPRO) 20 MG tablet, , Disp: , Rfl:  .  levothyroxine (SYNTHROID) 50 MCG tablet, Take by mouth., Disp: , Rfl:  .  LORazepam (ATIVAN) 0.5 MG tablet, TAKE 1 TABLET(0.5 MG) BY MOUTH EVERY 8 HOURS AS NEEDED FOR ANXIETY, Disp: 30 tablet, Rfl: 0 .  losartan  (COZAAR) 25 MG tablet, , Disp: , Rfl: 3 .  naproxen sodium (ANAPROX DS) 550 MG tablet, Take 1 tablet (550 mg total) by mouth 2 (two) times daily with a meal., Disp: 30 tablet, Rfl: 1 Allergies: Clarithromycin  Review of Systems  Constitutional: Negative for chills, fever and malaise/fatigue.  HENT: Negative for congestion, sinus pain and sore throat.   Eyes: Negative for blurred vision and pain.  Respiratory: Negative for cough and wheezing.   Cardiovascular: Negative for chest pain and leg swelling.  Gastrointestinal: Negative for abdominal pain, constipation, diarrhea, heartburn, nausea and vomiting.  Genitourinary: Negative for dysuria, frequency, hematuria and urgency.  Musculoskeletal: Negative for back pain, joint pain, myalgias and neck pain.  Skin: Negative for itching and rash.  Neurological: Negative for dizziness, tremors and weakness.  Endo/Heme/Allergies: Does not bruise/bleed easily.  Psychiatric/Behavioral: Negative for depression. The patient is not nervous/anxious and does not have insomnia.     Objective: There were no vitals taken for this visit. There were no vitals filed for this visit. Physical Exam Constitutional:      General: She is not in acute distress.    Appearance: She is well-developed.  HENT:     Head: Normocephalic and atraumatic. No laceration.  Right Ear: Hearing normal.     Left Ear: Hearing normal.     Mouth/Throat:     Pharynx: Uvula midline.  Eyes:     Pupils: Pupils are equal, round, and reactive to light.  Neck:     Thyroid: No thyromegaly.  Cardiovascular:     Rate and Rhythm: Normal rate and regular rhythm.     Heart sounds: No murmur heard.  No friction rub. No gallop.   Pulmonary:     Effort: Pulmonary effort is normal. No respiratory distress.     Breath sounds: Normal breath sounds. No wheezing.  Chest:     Breasts:        Right: No mass, skin change or tenderness.        Left: No mass, skin change or tenderness.    Abdominal:     General: Bowel sounds are normal. There is no distension.     Palpations: Abdomen is soft.     Tenderness: There is no abdominal tenderness. There is no rebound.  Musculoskeletal:        General: Normal range of motion.     Cervical back: Normal range of motion and neck supple.  Neurological:     Mental Status: She is alert and oriented to person, place, and time.     Cranial Nerves: No cranial nerve deficit.  Skin:    General: Skin is warm and dry.  Psychiatric:        Judgment: Judgment normal.  Vitals reviewed.     Assessment: 1. Menorrhagia with regular cycle   Plan TLH BS w preservation of ovaries  I have had a careful discussion with this patient about all the options available and the risk/benefits of each. I have fully informed this patient that surgery may subject her to a variety of discomforts and risks: She understands that most patients have surgery with little difficulty, but problems can happen ranging from minor to fatal. These include nausea, vomiting, pain, bleeding, infection, poor healing, hernia, or formation of adhesions. Unexpected reactions may occur from any drug or anesthetic given. Unintended injury may occur to other pelvic or abdominal structures such as Fallopian tubes, ovaries, bladder, ureter (tube from kidney to bladder), or bowel. Nerves going from the pelvis to the legs may be injured. Any such injury may require immediate or later additional surgery to correct the problem. Excessive blood loss requiring transfusion is very unlikely but possible. Dangerous blood clots may form in the legs or lungs. Physical and sexual activity will be restricted in varying degrees for an indeterminate period of time but most often 2-6 weeks.  Finally, she understands that it is impossible to list every possible undesirable effect and that the condition for which surgery is done is not always cured or significantly improved, and in rare cases may be even  worse.Ample time was given to answer all questions.  Barnett Applebaum, MD, [facility], [facility] 08/01/2020  8:03 AM

## 2020-08-01 NOTE — Progress Notes (Signed)
PRE-OPERATIVE HISTORY AND PHYSICAL EXAM  HPI:  Jacqueline Montoya is a 44 y.o. G2P1011 No LMP recorded. (Menstrual status: Irregular Periods).; she is being admitted for surgery related to abnormal uterine bleeding and menorrhagia. Her menses are regular every 28-30 days, lasting4-5days, heavy flow, with clots. Dysmenorrheanone. No BTB. Hx of AUB and menorrhagia earlier this yr. Neg EMB 6/21 after thickened EM on u/s. Did POPs in past but didn't like them, tried [place] 8/19 but it spontaneously expelled a month later.Didn't have any relief with lysteda. Pt interested in hyst.No sx's of menopause.  No desire for fertility.  PAP and EMB normal.  PMHx: Past Medical History:  Diagnosis Date  . Abnormal Pap smear of cervix   . Anxiety 2020  . Asthma    seasonal  . Depression 2020  . Elevated TSH   . Hemorrhoid   . Hemorrhoids   . Hyperlipidemia   . Hypertension   . Kidney stone 2001  . Spontaneous abortion    Past Surgical History:  Procedure Laterality Date  . ANAL FISSURECTOMY  2014  . ANAL SPHINCTEROTOMY  2014  . CESAREAN SECTION    . leap     Family History  Problem Relation Age of Onset  . Diabetes Father   . Hypothyroidism Mother   . Uterine cancer Maternal Aunt 60       has contact  . Breast cancer Neg Hx    Social History   Tobacco Use  . Smoking status: Former Smoker    Quit date: 09/29/2004    Years since quitting: 15.8  . Smokeless tobacco: Never Used  Vaping Use  . Vaping Use: Never used  Substance Use Topics  . Alcohol use: Yes    Comment: occasionally  . Drug use: No    Current Outpatient Medications:  .  cetirizine (ZYRTEC) 10 MG tablet, Take 10 mg by mouth daily., Disp: , Rfl:  .  escitalopram (LEXAPRO) 20 MG tablet, , Disp: , Rfl:  .  levothyroxine (SYNTHROID) 50 MCG tablet, Take by mouth., Disp: , Rfl:  .  LORazepam (ATIVAN) 0.5 MG tablet, TAKE 1 TABLET(0.5 MG) BY MOUTH EVERY 8 HOURS AS NEEDED FOR ANXIETY, Disp: 30 tablet, Rfl: 0 .  losartan  (COZAAR) 25 MG tablet, , Disp: , Rfl: 3 .  naproxen sodium (ANAPROX DS) 550 MG tablet, Take 1 tablet (550 mg total) by mouth 2 (two) times daily with a meal., Disp: 30 tablet, Rfl: 1 Allergies: Clarithromycin  Review of Systems  Constitutional: Negative for chills, fever and malaise/fatigue.  HENT: Negative for congestion, sinus pain and sore throat.   Eyes: Negative for blurred vision and pain.  Respiratory: Negative for cough and wheezing.   Cardiovascular: Negative for chest pain and leg swelling.  Gastrointestinal: Negative for abdominal pain, constipation, diarrhea, heartburn, nausea and vomiting.  Genitourinary: Negative for dysuria, frequency, hematuria and urgency.  Musculoskeletal: Negative for back pain, joint pain, myalgias and neck pain.  Skin: Negative for itching and rash.  Neurological: Negative for dizziness, tremors and weakness.  Endo/Heme/Allergies: Does not bruise/bleed easily.  Psychiatric/Behavioral: Negative for depression. The patient is not nervous/anxious and does not have insomnia.     Objective: There were no vitals taken for this visit. There were no vitals filed for this visit. Physical Exam Constitutional:      General: She is not in acute distress.    Appearance: She is well-developed.  HENT:     Head: Normocephalic and atraumatic. No laceration.  Right Ear: Hearing normal.     Left Ear: Hearing normal.     Mouth/Throat:     Pharynx: Uvula midline.  Eyes:     Pupils: Pupils are equal, round, and reactive to light.  Neck:     Thyroid: No thyromegaly.  Cardiovascular:     Rate and Rhythm: Normal rate and regular rhythm.     Heart sounds: No murmur heard.  No friction rub. No gallop.   Pulmonary:     Effort: Pulmonary effort is normal. No respiratory distress.     Breath sounds: Normal breath sounds. No wheezing.  Chest:     Breasts:        Right: No mass, skin change or tenderness.        Left: No mass, skin change or tenderness.    Abdominal:     General: Bowel sounds are normal. There is no distension.     Palpations: Abdomen is soft.     Tenderness: There is no abdominal tenderness. There is no rebound.  Musculoskeletal:        General: Normal range of motion.     Cervical back: Normal range of motion and neck supple.  Neurological:     Mental Status: She is alert and oriented to person, place, and time.     Cranial Nerves: No cranial nerve deficit.  Skin:    General: Skin is warm and dry.  Psychiatric:        Judgment: Judgment normal.  Vitals reviewed.     Assessment: 1. Menorrhagia with regular cycle   Plan TLH BS w preservation of ovaries  I have had a careful discussion with this patient about all the options available and the risk/benefits of each. I have fully informed this patient that surgery may subject her to a variety of discomforts and risks: She understands that most patients have surgery with little difficulty, but problems can happen ranging from minor to fatal. These include nausea, vomiting, pain, bleeding, infection, poor healing, hernia, or formation of adhesions. Unexpected reactions may occur from any drug or anesthetic given. Unintended injury may occur to other pelvic or abdominal structures such as Fallopian tubes, ovaries, bladder, ureter (tube from kidney to bladder), or bowel. Nerves going from the pelvis to the legs may be injured. Any such injury may require immediate or later additional surgery to correct the problem. Excessive blood loss requiring transfusion is very unlikely but possible. Dangerous blood clots may form in the legs or lungs. Physical and sexual activity will be restricted in varying degrees for an indeterminate period of time but most often 2-6 weeks.  Finally, she understands that it is impossible to list every possible undesirable effect and that the condition for which surgery is done is not always cured or significantly improved, and in rare cases may be even  worse.Ample time was given to answer all questions.  Barnett Applebaum, MD, [facility], [facility] 08/01/2020  8:03 AM

## 2020-08-01 NOTE — Patient Instructions (Addendum)
PRE ADMISSION TESTING For Covid, prior to procedure Tuesday 9:00-10:00 [facility] entrance (drive up)  Results in 48-72 hours You will not receive notification if test results are negative. If positive for Covid19, your provider will notify you by phone, with additional instructions.   Total Laparoscopic Hysterectomy, Care After This sheet gives you information about how to care for yourself after your procedure. Your health care provider may also give you more specific instructions. If you have problems or questions, contact your health care provider. What can I expect after the procedure? After the procedure, it is common to have:  Pain and bruising around your incisions.  A sore throat, if a breathing tube was used during surgery.  Fatigue.  Poor appetite.  Less interest in sex. If your ovaries were also removed, it is also common to have symptoms of menopause such as hot flashes, night sweats, and lack of sleep (insomnia). Follow these instructions at home: Bathing  Do not take baths, swim, or use a hot tub until your health care provider approves. You may need to only take showers for 2-3 weeks.  Keep your bandage (dressing) dry until your health care provider says it can be removed. Incision care   Follow instructions from your health care provider about how to take care of your incisions. Make sure you: ? Wash your hands with soap and water before you change your dressing. If soap and water are not available, use hand sanitizer. ? Change your dressing as told by your health care provider. ? Leave stitches (sutures), skin glue, or adhesive strips in place. These skin closures may need to stay in place for 2 weeks or longer. If adhesive strip edges start to loosen and curl up, you may trim the loose edges. Do not remove adhesive strips completely unless your health care provider tells you to do that.  Check your incision area every day for signs of infection.  Check for: ? Redness, swelling, or pain. ? Fluid or blood. ? Warmth. ? Pus or a bad smell. Activity  Get plenty of rest and sleep.  Do not lift anything that is heavier than 10 lbs (4.5 kg) for one month after surgery, or as long as told by your health care provider.  Do not drive or use heavy machinery while taking prescription pain medicine.  Do not drive for 24 hours if you were given a medicine to help you relax (sedative).  Return to your normal activities as told by your health care provider. Ask your health care provider what activities are safe for you. Lifestyle   Do not use any products that contain nicotine or tobacco, such as cigarettes and e-cigarettes. These can delay healing. If you need help quitting, ask your health care provider.  Do not drink alcohol until your health care provider approves. General instructions  Do not douche, use tampons, or have sex for at least 6 weeks, or as told by your health care provider.  Take over-the-counter and prescription medicines only as told by your health care provider.  To monitor yourself for a fever, take your temperature at least once a day during recovery.  If you struggle with physical or emotional changes after your procedure, speak with your health care provider or a therapist.  To prevent or treat constipation while you are taking prescription pain medicine, your health care provider may recommend that you: ? Drink enough fluid to keep your urine clear or pale yellow. ? Take over-the-counter or prescription medicines. ?   Eat foods that are high in fiber, such as fresh fruits and vegetables, whole grains, and beans. ? Limit foods that are high in fat and processed sugars, such as fried and sweet foods.  Keep all follow-up visits as told by your health care provider. This is important. Contact a health care provider if:  You have chills or a fever.  You have redness, swelling, or pain around an incision.  You  have fluid or blood coming from an incision.  Your incision feels warm to the touch.  You have pus or a bad smell coming from an incision.  An incision breaks open.  You feel dizzy or light-headed.  You have pain or bleeding when you urinate.  You have diarrhea, nausea, or vomiting that does not go away.  You have abnormal vaginal discharge.  You have a rash.  You have pain that does not get better with medicine. Get help right away if:  You have a fever and your symptoms suddenly get worse.  You have severe abdominal pain.  You have chest pain.  You have shortness of breath.  You faint.  You have pain, swelling, or redness on your leg.  You have heavy vaginal bleeding with blood clots. Summary  After the procedure it is common to have abdominal pain. Your provider will give you medication for this.  Do not take baths, swim, or use a hot tub until your health care provider approves.  Do not lift anything that is heavier than 10 lbs (4.5 kg) for one month after surgery, or as long as told by your health care provider.  Notify your provider if you have any signs or symptoms of infection after the procedure. This information is not intended to replace advice given to you by your health care provider. Make sure you discuss any questions you have with your health care provider. Document Revised: 08/28/2017 Document Reviewed: 11/26/2016 Elsevier Patient Education  2020 [employer]  

## 2020-08-02 ENCOUNTER — Encounter
Admission: RE | Admit: 2020-08-02 | Discharge: 2020-08-02 | Disposition: A | Discharge status: Home / Self Care | Payer: BC Managed Care – PPO | Source: Ambulatory Visit | Attending: Obstetrics & Gynecology | Admitting: Obstetrics & Gynecology

## 2020-08-02 HISTORY — DX: Hypothyroidism, unspecified: E03.9

## 2020-08-02 HISTORY — DX: Gastro-esophageal reflux disease without esophagitis: K21.9

## 2020-08-02 NOTE — Patient Instructions (Addendum)
Your procedure is scheduled on: 08/09/20 Report to [place]. To find out your arrival time please call [phone] between 1PM - 3PM on 08/08/20.  Remember: Instructions that are not followed completely may result in serious medical risk, up to and including death, or upon the discretion of your surgeon and anesthesiologist your surgery may need to be rescheduled.     _X__ 1. Do not eat food after midnight the night before your procedure.                 No gum chewing or hard candies. You may drink clear liquids up to 2 hours                 before you are scheduled to arrive for your surgery- DO not drink clear                 liquids within 2 hours of the start of your surgery.                 Clear Liquids include:  water, apple juice without pulp, clear carbohydrate                 drink such as Clearfast or Gatorade, Black Coffee or Tea (Do not add                 anything to coffee or tea). Diabetics water only  __X__2.  On the morning of surgery brush your teeth with toothpaste and water, you                 may rinse your mouth with mouthwash if you wish.  Do not swallow any              toothpaste of mouthwash.     _X__ 3.  No Alcohol for 24 hours before or after surgery.   _X__ 4.  Do Not Smoke or use e-cigarettes For 24 Hours Prior to Your Surgery.                 Do not use any chewable tobacco products for at least 6 hours prior to                 surgery.  ____  5.  Bring all medications with you on the day of surgery if instructed.   __X__  6.  Notify your doctor if there is any change in your medical condition      (cold, fever, infections).     Do not wear jewelry, make-up, hairpins, clips or nail polish. Do not wear lotions, powders, or perfumes.  Do not shave 48 hours prior to surgery. Men may shave face and neck. Do not bring valuables to the hospital.    [facility] is not responsible for any belongings  or valuables.  Contacts, dentures/partials or body piercings may not be worn into surgery. Bring a case for your contacts, glasses or hearing aids, a denture cup will be supplied. Leave your suitcase in the car. After surgery it may be brought to your room. For patients admitted to the hospital, discharge time is determined by your treatment team.   Patients discharged the day of surgery will not be allowed to drive home.   Please read over the following fact sheets that you were given:   MRSA Information  __X__ Take these medicines the morning of surgery with A SIP OF WATER:  1. escitalopram (LEXAPRO) 20 MG tablet  2. famotidine (PEPCID) 20 MG tablet  3. levothyroxine (SYNTHROID) 50 MCG tablet (0.05 MG)  4. LORazepam (ATIVAN) 0.5 MG tablet IF NEEDED  5. cetirizine (ZYRTEC) 10 MG tablet IF NEEDED  6.  ____ Fleet Enema (as directed)   __X__ Use CHG Soap/SAGE wipes as directed  ____ Use inhalers on the day of surgery  ____ Stop metformin/Janumet/Farxiga 2 days prior to surgery    ____ Take 1/2 of usual insulin dose the night before surgery. No insulin the morning          of surgery.   ____ Stop Blood Thinners Coumadin/Plavix/Xarelto/Pleta/Pradaxa/Eliquis/Effient/Aspirin  on   Or contact your Surgeon, Cardiologist or Medical Doctor regarding  ability to stop your blood thinners  __X__ Stop Anti-inflammatories 7 days before surgery such as Advil, Ibuprofen, Motrin,  BC or Goodies Powder, Naprosyn, Naproxen, Aleve, Aspirin    __X__ Stop all herbal supplements, fish oil or vitamin E until after surgery.    ____ Bring C-Pap to the hospital.

## 2020-08-07 ENCOUNTER — Encounter
Admission: RE | Admit: 2020-08-07 | Discharge: 2020-08-07 | Disposition: A | Discharge status: Home / Self Care | Payer: BC Managed Care – PPO | Source: Ambulatory Visit | Attending: Obstetrics & Gynecology | Admitting: Obstetrics & Gynecology

## 2020-08-07 ENCOUNTER — Other Ambulatory Visit: Payer: Self-pay

## 2020-08-07 ENCOUNTER — Other Ambulatory Visit: Payer: BC Managed Care – PPO

## 2020-08-07 DIAGNOSIS — Z01818 Encounter for other preprocedural examination: Secondary | ICD-10-CM | POA: Insufficient documentation

## 2020-08-07 DIAGNOSIS — N920 Excessive and frequent menstruation with regular cycle: Secondary | ICD-10-CM | POA: Diagnosis not present

## 2020-08-07 DIAGNOSIS — N939 Abnormal uterine and vaginal bleeding, unspecified: Secondary | ICD-10-CM | POA: Diagnosis present

## 2020-08-07 DIAGNOSIS — Z87891 Personal history of nicotine dependence: Secondary | ICD-10-CM | POA: Diagnosis not present

## 2020-08-07 DIAGNOSIS — I10 Essential (primary) hypertension: Secondary | ICD-10-CM | POA: Insufficient documentation

## 2020-08-07 DIAGNOSIS — Z20822 Contact with and (suspected) exposure to covid-19: Secondary | ICD-10-CM | POA: Diagnosis not present

## 2020-08-07 DIAGNOSIS — Z79890 Hormone replacement therapy (postmenopausal): Secondary | ICD-10-CM | POA: Diagnosis not present

## 2020-08-07 DIAGNOSIS — Z79899 Other long term (current) drug therapy: Secondary | ICD-10-CM | POA: Diagnosis not present

## 2020-08-07 LAB — TYPE AND SCREEN
ABO/RH(D): O POS
Antibody Screen: NEGATIVE

## 2020-08-07 LAB — CBC
HCT: 39.1 % (ref 36.0–46.0)
Hemoglobin: 12.7 g/dL (ref 12.0–15.0)
MCH: 30.6 pg (ref 26.0–34.0)
MCHC: 32.5 g/dL (ref 30.0–36.0)
MCV: 94.2 fL (ref 80.0–100.0)
Platelets: 220 10*3/uL (ref 150–400)
RBC: 4.15 MIL/uL (ref 3.87–5.11)
RDW: 12.9 % (ref 11.5–15.5)
WBC: 4.8 10*3/uL (ref 4.0–10.5)
nRBC: 0 % (ref 0.0–0.2)

## 2020-08-07 LAB — SARS CORONAVIRUS 2 (TAT 6-24 HRS): SARS Coronavirus 2: NEGATIVE

## 2020-08-09 ENCOUNTER — Ambulatory Visit: Payer: BC Managed Care – PPO | Admitting: Urgent Care

## 2020-08-09 ENCOUNTER — Encounter
Admission: RE | Disposition: A | Discharge status: Home / Self Care | Payer: Self-pay | Source: Home / Self Care | Attending: Obstetrics & Gynecology

## 2020-08-09 ENCOUNTER — Encounter: Payer: Self-pay | Admitting: Obstetrics & Gynecology

## 2020-08-09 ENCOUNTER — Other Ambulatory Visit: Payer: Self-pay

## 2020-08-09 ENCOUNTER — Ambulatory Visit
Admission: RE | Admit: 2020-08-09 | Discharge: 2020-08-09 | Disposition: A | Discharge status: Home / Self Care | Payer: BC Managed Care – PPO | Attending: Obstetrics & Gynecology | Admitting: Obstetrics & Gynecology

## 2020-08-09 DIAGNOSIS — N939 Abnormal uterine and vaginal bleeding, unspecified: Secondary | ICD-10-CM | POA: Insufficient documentation

## 2020-08-09 DIAGNOSIS — N72 Inflammatory disease of cervix uteri: Secondary | ICD-10-CM | POA: Diagnosis not present

## 2020-08-09 DIAGNOSIS — N920 Excessive and frequent menstruation with regular cycle: Secondary | ICD-10-CM | POA: Diagnosis not present

## 2020-08-09 DIAGNOSIS — N888 Other specified noninflammatory disorders of cervix uteri: Secondary | ICD-10-CM

## 2020-08-09 DIAGNOSIS — Z20822 Contact with and (suspected) exposure to covid-19: Secondary | ICD-10-CM | POA: Insufficient documentation

## 2020-08-09 DIAGNOSIS — N800 Endometriosis of uterus: Secondary | ICD-10-CM

## 2020-08-09 DIAGNOSIS — N838 Other noninflammatory disorders of ovary, fallopian tube and broad ligament: Secondary | ICD-10-CM

## 2020-08-09 DIAGNOSIS — Z79899 Other long term (current) drug therapy: Secondary | ICD-10-CM | POA: Insufficient documentation

## 2020-08-09 DIAGNOSIS — Z79890 Hormone replacement therapy (postmenopausal): Secondary | ICD-10-CM | POA: Insufficient documentation

## 2020-08-09 DIAGNOSIS — Z87891 Personal history of nicotine dependence: Secondary | ICD-10-CM | POA: Insufficient documentation

## 2020-08-09 HISTORY — PX: TOTAL LAPAROSCOPIC HYSTERECTOMY WITH SALPINGECTOMY: SHX6742

## 2020-08-09 LAB — POCT PREGNANCY, URINE: Preg Test, Ur: NEGATIVE

## 2020-08-09 LAB — ABO/RH: ABO/RH(D): O POS

## 2020-08-09 SURGERY — HYSTERECTOMY, TOTAL, LAPAROSCOPIC, WITH SALPINGECTOMY
Anesthesia: General | Site: Abdomen | Laterality: Bilateral

## 2020-08-09 MED ORDER — PHENYLEPHRINE HCL (PRESSORS) 10 MG/ML IV SOLN
INTRAVENOUS | Status: DC | PRN
  Administered 2020-08-09: 200 ug via INTRAVENOUS

## 2020-08-09 MED ORDER — FENTANYL CITRATE (PF) 100 MCG/2ML IJ SOLN
INTRAMUSCULAR | Status: AC
  Administered 2020-08-09: 25 ug via INTRAVENOUS
  Filled 2020-08-09: qty 2

## 2020-08-09 MED ORDER — CHLORHEXIDINE GLUCONATE 0.12 % MT SOLN
15.0000 mL | Freq: Once | OROMUCOSAL | Status: AC
  Administered 2020-08-09: 15 mL via OROMUCOSAL

## 2020-08-09 MED ORDER — BUPIVACAINE HCL (PF) 0.5 % IJ SOLN
INTRAMUSCULAR | Status: DC | PRN
  Administered 2020-08-09: 13 mL

## 2020-08-09 MED ORDER — LACTATED RINGERS IV SOLN: INTRAVENOUS | Status: DC

## 2020-08-09 MED ORDER — ACETAMINOPHEN 160 MG/5ML PO SOLN
325.0000 mg | ORAL | Status: DC | PRN
  Filled 2020-08-09: qty 20.3

## 2020-08-09 MED ORDER — HYDROCODONE-ACETAMINOPHEN 7.5-325 MG PO TABS
1.0000 | ORAL_TABLET | Freq: Once | ORAL | Status: DC | PRN
  Filled 2020-08-09: qty 1

## 2020-08-09 MED ORDER — ACETAMINOPHEN 325 MG PO TABS: 325.0000 mg | ORAL_TABLET | ORAL | Status: DC | PRN

## 2020-08-09 MED ORDER — EPHEDRINE 5 MG/ML INJ
INTRAVENOUS | Status: AC
  Filled 2020-08-09: qty 10

## 2020-08-09 MED ORDER — OXYCODONE-ACETAMINOPHEN 5-325 MG PO TABS: 1.0000 | ORAL_TABLET | ORAL | Status: DC | PRN

## 2020-08-09 MED ORDER — SODIUM CHLORIDE 0.9 % IV SOLN
INTRAVENOUS | Status: AC
  Filled 2020-08-09: qty 2

## 2020-08-09 MED ORDER — EPHEDRINE SULFATE 50 MG/ML IJ SOLN
INTRAMUSCULAR | Status: DC | PRN
  Administered 2020-08-09 (×2): 20 mg via INTRAVENOUS

## 2020-08-09 MED ORDER — KETOROLAC TROMETHAMINE 30 MG/ML IJ SOLN
INTRAMUSCULAR | Status: DC | PRN
  Administered 2020-08-09: 30 mg via INTRAVENOUS

## 2020-08-09 MED ORDER — KETOROLAC TROMETHAMINE 30 MG/ML IJ SOLN: 30.0000 mg | Freq: Once | INTRAMUSCULAR | Status: DC | PRN

## 2020-08-09 MED ORDER — MIDAZOLAM HCL 2 MG/2ML IJ SOLN
INTRAMUSCULAR | Status: AC
  Filled 2020-08-09: qty 2

## 2020-08-09 MED ORDER — ACETAMINOPHEN 325 MG PO TABS: 650.0000 mg | ORAL_TABLET | ORAL | Status: DC | PRN

## 2020-08-09 MED ORDER — LIDOCAINE HCL (CARDIAC) PF 100 MG/5ML IV SOSY
PREFILLED_SYRINGE | INTRAVENOUS | Status: DC | PRN
  Administered 2020-08-09: 50 mg via INTRAVENOUS

## 2020-08-09 MED ORDER — DROPERIDOL 2.5 MG/ML IJ SOLN
0.6250 mg | Freq: Once | INTRAMUSCULAR | Status: DC | PRN
  Filled 2020-08-09: qty 2

## 2020-08-09 MED ORDER — PROPOFOL 10 MG/ML IV BOLUS
INTRAVENOUS | Status: DC | PRN
  Administered 2020-08-09: 150 mg via INTRAVENOUS

## 2020-08-09 MED ORDER — CHLORHEXIDINE GLUCONATE 0.12 % MT SOLN
OROMUCOSAL | Status: AC
  Filled 2020-08-09: qty 15

## 2020-08-09 MED ORDER — OXYCODONE-ACETAMINOPHEN 5-325 MG PO TABS: 1.0000 | ORAL_TABLET | ORAL | 0 refills | Status: DC | PRN

## 2020-08-09 MED ORDER — ONDANSETRON HCL 4 MG/2ML IJ SOLN
INTRAMUSCULAR | Status: AC
  Filled 2020-08-09: qty 2

## 2020-08-09 MED ORDER — PROMETHAZINE HCL 25 MG/ML IJ SOLN: 6.2500 mg | INTRAMUSCULAR | Status: DC | PRN

## 2020-08-09 MED ORDER — MEPERIDINE HCL 50 MG/ML IJ SOLN: 6.2500 mg | INTRAMUSCULAR | Status: DC | PRN

## 2020-08-09 MED ORDER — ACETAMINOPHEN 10 MG/ML IV SOLN
INTRAVENOUS | Status: AC
  Filled 2020-08-09: qty 100

## 2020-08-09 MED ORDER — POVIDONE-IODINE 10 % EX SWAB
2.0000 "application " | Freq: Once | CUTANEOUS | Status: AC
  Administered 2020-08-09: 2 via TOPICAL

## 2020-08-09 MED ORDER — ORAL CARE MOUTH RINSE: 15.0000 mL | Freq: Once | OROMUCOSAL | Status: AC

## 2020-08-09 MED ORDER — ACETAMINOPHEN 650 MG RE SUPP
650.0000 mg | RECTAL | Status: DC | PRN
  Filled 2020-08-09: qty 1

## 2020-08-09 MED ORDER — SODIUM CHLORIDE 0.9 % IV SOLN
2.0000 g | INTRAVENOUS | Status: AC
  Administered 2020-08-09: 2 g via INTRAVENOUS

## 2020-08-09 MED ORDER — FENTANYL CITRATE (PF) 250 MCG/5ML IJ SOLN
INTRAMUSCULAR | Status: AC
  Filled 2020-08-09: qty 5

## 2020-08-09 MED ORDER — ROCURONIUM BROMIDE 100 MG/10ML IV SOLN
INTRAVENOUS | Status: DC | PRN
  Administered 2020-08-09: 70 mg via INTRAVENOUS

## 2020-08-09 MED ORDER — ONDANSETRON HCL 4 MG/2ML IJ SOLN
INTRAMUSCULAR | Status: DC | PRN
  Administered 2020-08-09: 4 mg via INTRAVENOUS

## 2020-08-09 MED ORDER — DEXAMETHASONE SODIUM PHOSPHATE 10 MG/ML IJ SOLN
INTRAMUSCULAR | Status: AC
  Filled 2020-08-09: qty 1

## 2020-08-09 MED ORDER — FENTANYL CITRATE (PF) 100 MCG/2ML IJ SOLN
25.0000 ug | INTRAMUSCULAR | Status: DC | PRN
  Administered 2020-08-09 (×3): 25 ug via INTRAVENOUS

## 2020-08-09 MED ORDER — SODIUM CHLORIDE 0.9 % IR SOLN
Status: DC | PRN
  Administered 2020-08-09: 1000 mL

## 2020-08-09 MED ORDER — LIDOCAINE HCL (PF) 2 % IJ SOLN
INTRAMUSCULAR | Status: AC
  Filled 2020-08-09: qty 5

## 2020-08-09 MED ORDER — MIDAZOLAM HCL 2 MG/2ML IJ SOLN
INTRAMUSCULAR | Status: DC | PRN
  Administered 2020-08-09: 2 mg via INTRAVENOUS

## 2020-08-09 MED ORDER — MORPHINE SULFATE (PF) 2 MG/ML IV SOLN: 1.0000 mg | INTRAVENOUS | Status: DC | PRN

## 2020-08-09 MED ORDER — DEXAMETHASONE SODIUM PHOSPHATE 10 MG/ML IJ SOLN
INTRAMUSCULAR | Status: DC | PRN
  Administered 2020-08-09: 10 mg via INTRAVENOUS

## 2020-08-09 MED ORDER — ROCURONIUM BROMIDE 10 MG/ML (PF) SYRINGE
PREFILLED_SYRINGE | INTRAVENOUS | Status: AC
  Filled 2020-08-09: qty 10

## 2020-08-09 MED ORDER — PROPOFOL 10 MG/ML IV BOLUS
INTRAVENOUS | Status: AC
  Filled 2020-08-09: qty 20

## 2020-08-09 MED ORDER — SILVER NITRATE-POT NITRATE 75-25 % EX MISC
CUTANEOUS | Status: AC
  Filled 2020-08-09: qty 10

## 2020-08-09 MED ORDER — ACETAMINOPHEN 10 MG/ML IV SOLN
INTRAVENOUS | Status: DC | PRN
  Administered 2020-08-09: 1000 mg via INTRAVENOUS

## 2020-08-09 MED ORDER — SODIUM CHLORIDE 0.9 % IR SOLN
Status: DC | PRN
  Administered 2020-08-09: 1000 mL via INTRAVESICAL

## 2020-08-09 MED ORDER — SUGAMMADEX SODIUM 500 MG/5ML IV SOLN
INTRAVENOUS | Status: DC | PRN
  Administered 2020-08-09: 200 mg via INTRAVENOUS

## 2020-08-09 MED ORDER — BUPIVACAINE HCL (PF) 0.5 % IJ SOLN
INTRAMUSCULAR | Status: AC
  Filled 2020-08-09: qty 30

## 2020-08-09 MED ORDER — FENTANYL CITRATE (PF) 100 MCG/2ML IJ SOLN
INTRAMUSCULAR | Status: DC | PRN
  Administered 2020-08-09 (×2): 100 ug via INTRAVENOUS

## 2020-08-09 SURGICAL SUPPLY — 64 items
ADH SKN CLS APL DERMABOND .7 (GAUZE/BANDAGES/DRESSINGS) ×1
APL PRP STRL LF DISP 70% ISPRP (MISCELLANEOUS) ×1
BAG DRN RND TRDRP ANRFLXCHMBR (UROLOGICAL SUPPLIES) ×1
BAG URINE DRAIN 2000ML AR STRL (UROLOGICAL SUPPLIES) ×2 IMPLANT
BLADE SURG SZ11 CARB STEEL (BLADE) ×2 IMPLANT
CANISTER SUCT 1200ML W/VALVE (MISCELLANEOUS) IMPLANT
CATH FOLEY 2WAY  5CC 16FR (CATHETERS) ×1
CATH FOLEY 2WAY 5CC 16FR (CATHETERS) ×1
CATH URTH 16FR FL 2W BLN LF (CATHETERS) ×1 IMPLANT
CHLORAPREP W/TINT 26 (MISCELLANEOUS) ×2 IMPLANT
COVER WAND RF STERILE (DRAPES) ×2 IMPLANT
DEFOGGER SCOPE WARMER CLEARIFY (MISCELLANEOUS) ×2 IMPLANT
DERMABOND ADVANCED (GAUZE/BANDAGES/DRESSINGS) ×1
DERMABOND ADVANCED .7 DNX12 (GAUZE/BANDAGES/DRESSINGS) ×1 IMPLANT
DEVICE SUTURE ENDOST 10MM (ENDOMECHANICALS) ×2 IMPLANT
DRAPE CAMERA CLOSED 9X96 (DRAPES) ×2 IMPLANT
DRSG TEGADERM 2-3/8X2-3/4 SM (GAUZE/BANDAGES/DRESSINGS) ×6 IMPLANT
GAUZE 4X4 16PLY RFD (DISPOSABLE) ×2 IMPLANT
GLOVE BIO SURGEON STRL SZ8 (GLOVE) ×4 IMPLANT
GLOVE BIOGEL M 6.5 STRL (GLOVE) ×2 IMPLANT
GLOVE BIOGEL M 7.0 STRL (GLOVE) ×2 IMPLANT
GLOVE BIOGEL M STER SZ 6 (GLOVE) ×2 IMPLANT
GLOVE BIOGEL PI IND STRL 6.5 (GLOVE) ×2 IMPLANT
GLOVE BIOGEL PI IND STRL 7.5 (GLOVE) ×1 IMPLANT
GLOVE BIOGEL PI INDICATOR 6.5 (GLOVE) ×2
GLOVE BIOGEL PI INDICATOR 7.5 (GLOVE) ×1
GLOVE INDICATOR 8.0 STRL GRN (GLOVE) ×4 IMPLANT
GOWN STRL REUS W/ TWL LRG LVL3 (GOWN DISPOSABLE) ×3 IMPLANT
GOWN STRL REUS W/ TWL XL LVL3 (GOWN DISPOSABLE) ×2 IMPLANT
GOWN STRL REUS W/TWL LRG LVL3 (GOWN DISPOSABLE) ×6
GOWN STRL REUS W/TWL XL LVL3 (GOWN DISPOSABLE) ×4
GRASPER SUT TROCAR 14GX15 (MISCELLANEOUS) ×2 IMPLANT
IRRIGATION STRYKERFLOW (MISCELLANEOUS) ×1 IMPLANT
IRRIGATOR STRYKERFLOW (MISCELLANEOUS) ×2
IV LACTATED RINGERS 1000ML (IV SOLUTION) IMPLANT
KIT PINK PAD W/HEAD ARE REST (MISCELLANEOUS) ×2
KIT PINK PAD W/HEAD ARM REST (MISCELLANEOUS) ×1 IMPLANT
KIT TURNOVER CYSTO (KITS) ×2 IMPLANT
LABEL OR SOLS (LABEL) ×2 IMPLANT
MANIFOLD NEPTUNE II (INSTRUMENTS) ×2 IMPLANT
MANIPULATOR VCARE LG CRV RETR (MISCELLANEOUS) IMPLANT
MANIPULATOR VCARE SML CRV RETR (MISCELLANEOUS) IMPLANT
MANIPULATOR VCARE STD CRV RETR (MISCELLANEOUS) ×2 IMPLANT
NEEDLE VERESS 14GA 120MM (NEEDLE) ×2 IMPLANT
NS IRRIG 500ML POUR BTL (IV SOLUTION) ×2 IMPLANT
OCCLUDER COLPOPNEUMO (BALLOONS) ×2 IMPLANT
PACK GYN LAPAROSCOPIC (MISCELLANEOUS) ×2 IMPLANT
PAD OB MATERNITY 4.3X12.25 (PERSONAL CARE ITEMS) ×2 IMPLANT
PAD PREP 24X41 OB/GYN DISP (PERSONAL CARE ITEMS) ×2 IMPLANT
PORT ACCESS TROCAR AIRSEAL 12 (TROCAR) IMPLANT
PORT ACCESS TROCAR AIRSEAL 5M (TROCAR)
SCISSORS METZENBAUM CVD 33 (INSTRUMENTS) ×2 IMPLANT
SET CYSTO W/LG BORE CLAMP LF (SET/KITS/TRAYS/PACK) ×2 IMPLANT
SET TRI-LUMEN FLTR TB AIRSEAL (TUBING) ×2 IMPLANT
SHEARS HARMONIC ACE PLUS 36CM (ENDOMECHANICALS) ×2 IMPLANT
SLEEVE ENDOPATH XCEL 5M (ENDOMECHANICALS) ×2 IMPLANT
SPONGE GAUZE 2X2 8PLY STRL LF (GAUZE/BANDAGES/DRESSINGS) ×6 IMPLANT
SUT ENDO VLOC 180-0-8IN (SUTURE) ×2 IMPLANT
SUT VIC AB 0 CT1 36 (SUTURE) ×2 IMPLANT
SUT VIC AB 4-0 FS2 27 (SUTURE) ×2 IMPLANT
SYR 10ML LL (SYRINGE) ×2 IMPLANT
SYR 50ML LL SCALE MARK (SYRINGE) ×2 IMPLANT
TROCAR PORT AIRSEAL 8X100 (TROCAR) ×2 IMPLANT
TROCAR XCEL NON-BLD 5MMX100MML (ENDOMECHANICALS) ×2 IMPLANT

## 2020-08-09 NOTE — Anesthesia Preprocedure Evaluation (Addendum)
Anesthesia Evaluation  Patient identified by MRN, date of birth, ID band Patient awake    Reviewed: Allergy & Precautions, H&P , NPO status , reviewed documented beta blocker date and time   Airway Mallampati: II  TM Distance: >3 FB Neck ROM: full    Dental  (+) Teeth Intact   Pulmonary asthma , former smoker,    Pulmonary exam normal        Cardiovascular hypertension, Normal cardiovascular exam     Neuro/Psych PSYCHIATRIC DISORDERS Anxiety Depression    GI/Hepatic GERD  Controlled,  Endo/Other  Hypothyroidism   Renal/GU Renal disease     Musculoskeletal   Abdominal   Peds  Hematology   Anesthesia Other Findings Past Medical History: No date: Abnormal Pap smear of cervix 2020: Anxiety No date: Asthma     Comment:  seasonal 2020: Depression No date: Elevated TSH No date: GERD (gastroesophageal reflux disease) No date: Hemorrhoid No date: Hemorrhoids No date: Hyperlipidemia No date: Hypertension No date: Hypothyroidism 2001: Kidney stone No date: Spontaneous abortion Past Surgical History: 2014: ANAL FISSURECTOMY 2014: ANAL SPHINCTEROTOMY No date: CESAREAN SECTION No date: leap   Reproductive/Obstetrics                            Anesthesia Physical Anesthesia Plan  ASA: II  Anesthesia Plan: General   Post-op Pain Management:    Induction: Intravenous  PONV Risk Score and Plan: 3 and Ondansetron, Treatment may vary due to age or medical condition, Dexamethasone and Midazolam  Airway Management Planned: Oral ETT  Additional Equipment:   Intra-op Plan:   Post-operative Plan: Extubation in OR  Informed Consent: I have reviewed the patients History and Physical, chart, labs and discussed the procedure including the risks, benefits and alternatives for the proposed anesthesia with the patient or authorized representative who has indicated his/her understanding and  acceptance.     Dental Advisory Given  Plan Discussed with: CRNA  Anesthesia Plan Comments:        Anesthesia Quick Evaluation

## 2020-08-09 NOTE — Anesthesia Postprocedure Evaluation (Signed)
Anesthesia Post Note  Patient: [name]  Procedure(s) Performed: TOTAL LAPAROSCOPIC HYSTERECTOMY WITH SALPINGECTOMY (Bilateral Abdomen)  Patient location during evaluation: PACU Anesthesia Type: General Level of consciousness: awake and alert Pain management: pain level controlled Vital Signs Assessment: post-procedure vital signs reviewed and stable Respiratory status: spontaneous breathing, nonlabored ventilation and respiratory function stable Cardiovascular status: blood pressure returned to baseline and stable Postop Assessment: no apparent nausea or vomiting Anesthetic complications: no   No complications documented.   Last Vitals:  Vitals:   08/09/20 1114 08/09/20 1152  BP: 102/63 (!) 105/56  Pulse: 77 78  Resp: 18 18  Temp:    SpO2: 97% 97%    Last Pain:  Vitals:   08/09/20 1100  TempSrc: Tympanic  PainSc: 2                  Alphonsus Sias

## 2020-08-09 NOTE — Op Note (Addendum)
Operative Report:  PRE-OP DIAGNOSIS: abnormal uterine bleeding AUB N93.9 menorrhagia with regular cycles N92.0   POST-OP DIAGNOSIS: abnormal uterine bleeding AUB N93.9 menorrhagia with regular cycles N92.0   PROCEDURE: Procedure(s): TOTAL LAPAROSCOPIC HYSTERECTOMY WITH SALPINGECTOMY CYSTOSCOPY  SURGEON: Barnett Applebaum, MD, FACOG  ASSISTANT: Dr Gilman Schmidt, MD, No other capable assistant available, in surgery requiring high level assistant.  ANESTHESIA: General endotracheal anesthesia  ESTIMATED BLOOD LOSS: less than 50   SPECIMENS: Uterus, Tubes.  COMPLICATIONS: None  DISPOSITION: stable to PACU  FINDINGS: Intraabdominal adhesions were not noted.   PROCEDURE:  . The patient was taken to the OR where anesthesia was administed. She was prepped and draped in the normal sterile fashion in the dorsal lithotomy position in the Tatum stirrups. A time out was performed. A Graves speculum was inserted, the cervix was grasped with a single tooth tenaculum and the endometrial cavity was sounded. The cervix was progressively dilated to a size 18 [place] with [employer] dilators. A V-Care uterine manipulator was inserted in the usual fashion without incident. Gloves were changed and attention was turned to the abdomen.   . An infraumbilical transverse 2mm skin incision was made with the scalpel after local anesthesia applied to the skin. A Veress-step needle was inserted in the usual fashion and confirmed using the hanging drop technique. A pneumoperitoneum was obtained by insufflation of CO2 (opening pressure of 20mmHg) to 69mmHg. An 8 mm trocar is then placed under direct visualization with the laparoscope. A diagnostic laparoscopy was performed yielding the previously described findings. Attention was turned to the left lower quadrant where after visualization of the inferior epigastric vessels a 4mm skin incision was made with the scalpel. A 5 mm laparoscopic port was inserted. The same procedure was  repeated in the right lower quadrant with a 56mm trocar. Attention was turned to the left aspect of the uterus, where after visualization of the ureter, the round ligament was coagulated and transected using the 78mm Harmonic Scapel. The anterior and posterior leafs of the broad ligament were dissected off as the anterior one was coagulated and transected in a caudal direction towards the cuff of the uterine manipulator.  Attention was then turned to the left fallopian tube which was recognized by visualization of the fimbria. The tube is excised to its attachment to the uterus. The uterine-ovarian ligament and its blood vessels were carefully coagulated and transected using the Harmonic scapel.  Attention was turned to the right aspect of the uterus where the same procedure was performed.  The vesicouterine reflection of the peritoneum was dissected with the harmonic scapel and the bladder flap was created bluntly.  The uterine vessels were coagulated and transected bilaterally using first bipolar cautery and then the harmonic scapel. A 360 degree, circumferential colpotomy was done to completely amputate the uterus with cervix and tubes. Once the specimen was amputated it was delivered through the vagina.   . The colpotomy was repaired in a simple running fashion using a delayed absorbable suture with an endo-stitch device.  Vaginal exam confirms complete closure.  The cavity was copiously irrigated. A survey of the pelvic cavity revealed adequate hemostasis and no injury to bowel, bladder, or ureter.   . A diagnostic cystoscopy was performed using saline distension of bladder with no lesions or injuries noted.  Bilateral urine flow from each ureteral orifice is visualized.  . At this point the procedure was finalized. The umbilical fascia incision is closed with a vicryl suture using the fascia closure device. All the instruments  were removed from the patient's body. Gas was expelled and patient is leveled.   Incisions are closed with skin adhesive.  The assistance of my assisting-physician was vital to resect and retract interchangably with self on each side.  . Patient goes to recovery room in stable condition.  All sponge, instrument, and needle counts are correct x2.     Barnett Applebaum, MD, [facility], [facility] 08/09/2020  9:33 AM

## 2020-08-09 NOTE — Interval H&P Note (Signed)
History and Physical Interval Note:  08/09/2020 7:07 AM  Jacqueline Montoya  has presented today for surgery, with the diagnosis of abnormal uterine bleeding AUB N93.9 menorrhagia with regular cycles N92.0.  The various methods of treatment have been discussed with the patient and family. After consideration of risks, benefits and other options for treatment, the patient has consented to  Procedure(s): TOTAL LAPAROSCOPIC HYSTERECTOMY WITH SALPINGECTOMY (Bilateral) as a surgical intervention.  The patient's history has been reviewed, patient examined, no change in status, stable for surgery.  I have reviewed the patient's chart and labs.  Questions were answered to the patient's satisfaction.     Hoyt Koch

## 2020-08-09 NOTE — Anesthesia Procedure Notes (Signed)
Procedure Name: Intubation Performed by: Gaynelle Cage, CRNA Pre-anesthesia Checklist: Patient identified, Emergency Drugs available, Suction available and Patient being monitored Patient Re-evaluated:Patient Re-evaluated prior to induction Oxygen Delivery Method: Circle system utilized Preoxygenation: Pre-oxygenation with 100% oxygen Induction Type: IV induction Ventilation: Mask ventilation without difficulty and Oral airway inserted - appropriate to patient size Laryngoscope Size: Mac and 3 Grade View: Grade I Tube type: Oral Tube size: 7.0 mm Number of attempts: 1 Airway Equipment and Method: Oral airway Placement Confirmation: ETT inserted through vocal cords under direct vision,  positive ETCO2 and breath sounds checked- equal and bilateral Secured at: 20 cm Tube secured with: Tape Dental Injury: Teeth and Oropharynx as per pre-operative assessment

## 2020-08-09 NOTE — Transfer of Care (Signed)
Immediate Anesthesia Transfer of Care Note  Patient: Jacqueline Montoya  Procedure(s) Performed: TOTAL LAPAROSCOPIC HYSTERECTOMY WITH SALPINGECTOMY (Bilateral Abdomen)  Patient Location: PACU  Anesthesia Type:General  Level of Consciousness: drowsy  Airway & Oxygen Therapy: Patient Spontanous Breathing and Patient connected to face mask oxygen  Post-op Assessment: Report given to RN and Post -op Vital signs reviewed and stable  Post vital signs: Reviewed and stable  Last Vitals:  Vitals Value Taken Time  BP 134/71 08/09/20 0938  Temp    Pulse 89 08/09/20 0939  Resp 14 08/09/20 0939  SpO2 97 % 08/09/20 0939  Vitals shown include unvalidated device data.  Last Pain:  Vitals:   08/09/20 0627  TempSrc: Temporal  PainSc: 0-No pain         Complications: No complications documented.

## 2020-08-09 NOTE — Discharge Instructions (Addendum)
Total Laparoscopic Hysterectomy, Care After This sheet gives you information about how to care for yourself after your procedure. Your health care provider may also give you more specific instructions. If you have problems or questions, contact your health care provider. What can I expect after the procedure? After the procedure, it is common to have:  Pain and bruising around your incisions.  A sore throat, if a breathing tube was used during surgery.  Fatigue.  Poor appetite.  Less interest in sex. If your ovaries were also removed, it is also common to have symptoms of menopause such as hot flashes, night sweats, and lack of sleep (insomnia). Follow these instructions at home: Bathing  Do not take baths, swim, or use a hot tub until your health care provider approves. You may need to only take showers for 2-3 weeks.  Keep your bandage (dressing) dry until your health care provider says it can be removed. Incision care   Follow instructions from your health care provider about how to take care of your incisions. Make sure you: ? Wash your hands with soap and water before you change your dressing. If soap and water are not available, use hand sanitizer. ? Change your dressing as told by your health care provider. ? Leave stitches (sutures), skin glue, or adhesive strips in place. These skin closures may need to stay in place for 2 weeks or longer. If adhesive strip edges start to loosen and curl up, you may trim the loose edges. Do not remove adhesive strips completely unless your health care provider tells you to do that.  Check your incision area every day for signs of infection. Check for: ? Redness, swelling, or pain. ? Fluid or blood. ? Warmth. ? Pus or a bad smell. Activity  Get plenty of rest and sleep.  Do not lift anything that is heavier than 10 lbs (4.5 kg) for one month after surgery, or as long as told by your health care provider.  Do not drive or use heavy  machinery while taking prescription pain medicine.  Do not drive for 24 hours if you were given a medicine to help you relax (sedative).  Return to your normal activities as told by your health care provider. Ask your health care provider what activities are safe for you. Lifestyle   Do not use any products that contain nicotine or tobacco, such as cigarettes and e-cigarettes. These can delay healing. If you need help quitting, ask your health care provider.  Do not drink alcohol until your health care provider approves. General instructions  Do not douche, use tampons, or have sex for at least 6 weeks, or as told by your health care provider.  Take over-the-counter and prescription medicines only as told by your health care provider.  To monitor yourself for a fever, take your temperature at least once a day during recovery.  If you struggle with physical or emotional changes after your procedure, speak with your health care provider or a therapist.  To prevent or treat constipation while you are taking prescription pain medicine, your health care provider may recommend that you: ? Drink enough fluid to keep your urine clear or pale yellow. ? Take over-the-counter or prescription medicines. ? Eat foods that are high in fiber, such as fresh fruits and vegetables, whole grains, and beans. ? Limit foods that are high in fat and processed sugars, such as fried and sweet foods.  Keep all follow-up visits as told by your health care provider.   This is important. Contact a health care provider if:  You have chills or a fever.  You have redness, swelling, or pain around an incision.  You have fluid or blood coming from an incision.  Your incision feels warm to the touch.  You have pus or a bad smell coming from an incision.  An incision breaks open.  You feel dizzy or light-headed.  You have pain or bleeding when you urinate.  You have diarrhea, nausea, or vomiting that does not  go away.  You have abnormal vaginal discharge.  You have a rash.  You have pain that does not get better with medicine. Get help right away if:  You have a fever and your symptoms suddenly get worse.  You have severe abdominal pain.  You have chest pain.  You have shortness of breath.  You faint.  You have pain, swelling, or redness on your leg.  You have heavy vaginal bleeding with blood clots. Summary  After the procedure it is common to have abdominal pain. Your provider will give you medication for this.  Do not take baths, swim, or use a hot tub until your health care provider approves.  Do not lift anything that is heavier than 10 lbs (4.5 kg) for one month after surgery, or as long as told by your health care provider.  Notify your provider if you have any signs or symptoms of infection after the procedure. This information is not intended to replace advice given to you by your health care provider. Make sure you discuss any questions you have with your health care provider. Document Revised: 08/28/2017 Document Reviewed: 11/26/2016 Elsevier Patient Education  2020 Elsevier Inc[employer]    AMBULATORY SURGERY  DISCHARGE INSTRUCTIONS   1) The drugs that you were given will stay in your system until tomorrow so for the next 24 hours you should not:  A) Drive an automobile B) Make any legal decisions C) Drink any alcoholic beverage   2) You may resume regular meals tomorrow.  Today it is better to start with liquids and gradually work up to solid foods.  You may eat anything you prefer, but it is better to start with liquids, then soup and crackers, and gradually work up to solid foods.   3) Please notify your doctor immediately if you have any unusual bleeding, trouble breathing, redness and pain at the surgery site, drainage, fever, or pain not relieved by medication.    4) Additional Instructions:        Please contact your physician with any problems  or Same Day Surgery at [phone], Monday through Friday 6 am to 4 pm, or [facility] [place] Main number at [phone]. 

## 2020-08-13 LAB — SURGICAL PATHOLOGY

## 2020-08-27 ENCOUNTER — Ambulatory Visit (INDEPENDENT_AMBULATORY_CARE_PROVIDER_SITE_OTHER): Payer: BC Managed Care – PPO | Admitting: Obstetrics & Gynecology

## 2020-08-27 ENCOUNTER — Other Ambulatory Visit: Payer: Self-pay

## 2020-08-27 ENCOUNTER — Encounter: Payer: Self-pay | Admitting: Obstetrics & Gynecology

## 2020-08-27 VITALS — BP 100/60 | Ht 62.0 in | Wt 184.0 lb

## 2020-08-27 DIAGNOSIS — Z90710 Acquired absence of both cervix and uterus: Secondary | ICD-10-CM

## 2020-08-27 NOTE — Progress Notes (Signed)
  Postoperative Follow-up Patient presents post op from [facility] BS for abnormal uterine bleeding, 2 weeks ago. Path: DIAGNOSIS:  A. 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  - PROLIFERATIVE ENDOMETRIUM.  - MYOMETRIUM WITH ADENOMYOSIS.  - RIGHT FALLOPIAN TUBE WITH FIMBRIATED END AND BENIGN PARATUBAL CYST.  - LEFT FALLOPIAN TUBE WITH FIMBRIATED END.  - NEGATIVE FOR ATYPIA AND MALIGNANCY.  Subjective: Patient reports marked improvement in her preop symptoms. Eating a regular diet without difficulty. The patient is not having any pain.  Activity: normal activities of daily living. Patient reports additional symptom's since surgery of None.  Objective: BP 100/60   Ht 5\' 2"  (1.575 m)   Wt 184 lb (83.5 kg)   BMI 33.65 kg/m  Physical Exam Constitutional:      General: She is not in acute distress.    Appearance: She is well-developed.  Cardiovascular:     Rate and Rhythm: Normal rate.  Pulmonary:     Effort: Pulmonary effort is normal.  Abdominal:     General: There is no distension.     Palpations: Abdomen is soft.     Tenderness: There is no abdominal tenderness.     Comments: Incision Healing Well   Musculoskeletal:        General: Normal range of motion.  Neurological:     Mental Status: She is alert and oriented to person, place, and time.     Cranial Nerves: No cranial nerve deficit.  Skin:    General: Skin is warm and dry.     Assessment: s/p :  total laparoscopic hysterectomy with bilateral salpingectomy progressing well  Plan: Patient has done well after surgery with no apparent complications.  I have discussed the post-operative course to date, and the expected progress moving forward.  The patient understands what complications to be concerned about.  I will see the patient in routine follow up, or sooner if needed.    Activity plan: No heavy lifting.  Pelvic rest.  Hoyt Koch 08/27/2020, 10:06 AM

## 2020-08-27 NOTE — Patient Instructions (Signed)
Mammogram due now please    Call [phone] to schedule at [facility]  Thank you for choosing [facility]. As part of our ongoing efforts to improve patient experience, we would appreciate your feedback. Please fill out the short survey that you will receive by mail or MyChart. Your opinion is important to [place]! - Dr. Kenton Kingfisher

## 2020-08-29 ENCOUNTER — Ambulatory Visit: Payer: BC Managed Care – PPO | Admitting: Obstetrics & Gynecology

## 2020-09-24 ENCOUNTER — Ambulatory Visit (INDEPENDENT_AMBULATORY_CARE_PROVIDER_SITE_OTHER): Payer: BC Managed Care – PPO | Admitting: Obstetrics & Gynecology

## 2020-09-24 ENCOUNTER — Other Ambulatory Visit: Payer: Self-pay

## 2020-09-24 ENCOUNTER — Encounter: Payer: Self-pay | Admitting: Obstetrics & Gynecology

## 2020-09-24 DIAGNOSIS — Z90710 Acquired absence of both cervix and uterus: Secondary | ICD-10-CM

## 2020-09-24 NOTE — Progress Notes (Signed)
Virtual Visit via Telephone Note  I connected with Jacqueline Montoya on 09/24/20 at 10:20 AM EST by telephone and verified that I am speaking with the correct person using two identifiers.  Location: Patient: Home Provider: Office   I discussed the limitations, risks, security and privacy concerns of performing an evaluation and management service by telephone and the availability of in person appointments. I also discussed with the patient that there may be a patient responsible charge related to this service. The patient expressed understanding and agreed to proceed.   History of Present Illness: Pt had recent Covid pos test, had sx's of decreased smell and taste.  No other sx's. She is recovering from [facility] 6 weeks ago, denies pain, bleeding, or hot flashes.   Observations/Objective: No exam today, due to telephone eVisit due to [facility] virus restriction on elective visits and procedures.  Prior visits reviewed along with ultrasounds/labs as indicated.  Assessment and Plan:   ICD-10-CM   1. S/P laparoscopic hysterectomy  Z90.710     Follow Up Instructions: Plan exam in 2-3 weeks to assess vag healing from [facility]   I discussed the assessment and treatment plan with the patient. The patient was provided an opportunity to ask questions and all were answered. The patient agreed with the plan and demonstrated an understanding of the instructions.   The patient was advised to call back or seek an in-person evaluation if the symptoms worsen or if the condition fails to improve as anticipated.  I provided 10 minutes of non-face-to-face time during this encounter.   Letitia Libra, MD

## 2020-10-09 ENCOUNTER — Ambulatory Visit (INDEPENDENT_AMBULATORY_CARE_PROVIDER_SITE_OTHER): Payer: BC Managed Care – PPO | Admitting: Obstetrics & Gynecology

## 2020-10-09 ENCOUNTER — Encounter: Payer: Self-pay | Admitting: Obstetrics & Gynecology

## 2020-10-09 ENCOUNTER — Other Ambulatory Visit: Payer: Self-pay

## 2020-10-09 VITALS — BP 110/60 | Ht 62.0 in | Wt 182.0 lb

## 2020-10-09 DIAGNOSIS — Z90710 Acquired absence of both cervix and uterus: Secondary | ICD-10-CM

## 2020-10-09 NOTE — Progress Notes (Signed)
  Postoperative Follow-up Patient presents post op from [facility] BS for abnormal uterine bleeding, 2 months ago.  Subjective: Patient reports marked improvement in her preop symptoms. Eating a regular diet without difficulty. The patient is not having any pain.  Activity: normal activities of daily living. Patient reports additional symptom's since surgery of No bleeding.  Objective: BP 110/60   Ht 5\' 2"  (1.575 m)   Wt 182 lb (82.6 kg)   BMI 33.29 kg/m  Physical Exam Constitutional:      General: She is not in acute distress.    Appearance: She is well-developed and well-nourished.  Genitourinary:     Vagina and rectum normal.     There is no rash, tenderness or lesion on the right labia.     There is no rash, tenderness or lesion on the left labia.    Genitourinary Comments: Cervix and uterus absent. Vaginal cuff healing well.     No vaginal erythema or bleeding.      Right Adnexa: not tender and no mass present.    Left Adnexa: not tender and no mass present.    Pelvic exam was performed with patient supine.  Cardiovascular:     Rate and Rhythm: Normal rate.  Pulmonary:     Effort: Pulmonary effort is normal.  Abdominal:     General: There is no distension.     Palpations: Abdomen is soft.     Tenderness: There is no abdominal tenderness.     Comments: Incision healing well.  Musculoskeletal:        General: Normal range of motion.  Neurological:     Mental Status: She is alert and oriented to person, place, and time.     Cranial Nerves: No cranial nerve deficit.  Skin:    General: Skin is warm and dry.  Psychiatric:        Mood and Affect: Mood and affect normal.    Assessment: s/p :  total laparoscopic hysterectomy with bilateral salpingectomy progressing well  Plan: Patient has done well after surgery with no apparent complications.  I have discussed the post-operative course to date, and the expected progress moving forward.  The patient understands what  complications to be concerned about.  I will see the patient in routine follow up, or sooner if needed.    Activity plan: No restriction.  Hoyt Koch 10/09/2020, 4:47 PM

## 2021-03-26 ENCOUNTER — Other Ambulatory Visit: Payer: Self-pay

## 2021-03-26 ENCOUNTER — Ambulatory Visit
Admission: RE | Admit: 2021-03-26 | Discharge: 2021-03-26 | Disposition: A | Discharge status: Home / Self Care | Payer: BC Managed Care – PPO | Source: Ambulatory Visit | Attending: Obstetrics and Gynecology | Admitting: Obstetrics and Gynecology

## 2021-03-26 DIAGNOSIS — Z1231 Encounter for screening mammogram for malignant neoplasm of breast: Secondary | ICD-10-CM | POA: Insufficient documentation

## 2021-05-29 ENCOUNTER — Other Ambulatory Visit: Payer: Self-pay

## 2021-05-29 ENCOUNTER — Ambulatory Visit (INDEPENDENT_AMBULATORY_CARE_PROVIDER_SITE_OTHER): Payer: BC Managed Care – PPO | Admitting: Obstetrics and Gynecology

## 2021-05-29 ENCOUNTER — Encounter: Payer: Self-pay | Admitting: Obstetrics and Gynecology

## 2021-05-29 VITALS — BP 124/80 | Ht 62.0 in | Wt 180.0 lb

## 2021-05-29 DIAGNOSIS — Z1231 Encounter for screening mammogram for malignant neoplasm of breast: Secondary | ICD-10-CM | POA: Diagnosis not present

## 2021-05-29 DIAGNOSIS — Z1211 Encounter for screening for malignant neoplasm of colon: Secondary | ICD-10-CM

## 2021-05-29 DIAGNOSIS — Z90710 Acquired absence of both cervix and uterus: Secondary | ICD-10-CM

## 2021-05-29 DIAGNOSIS — Z01419 Encounter for gynecological examination (general) (routine) without abnormal findings: Secondary | ICD-10-CM | POA: Diagnosis not present

## 2021-05-29 NOTE — Progress Notes (Signed)
Chief Complaint  Patient presents with   Gynecologic Exam    No concerns    HPI:      [name] is a 45 y.o. G2P1011 who LMP was Patient's last menstrual period was 05/13/2020 (approximate)., presents today for her annual examination.  Her menses are absent due to [facility] BS for abnormal uterine bleeding 11/21 with Dr. Kenton Kingfisher. Hx of AUB and menorrhagia last yr. Doing well.  Neg EMB 6/21 after thickened EM on u/s. Did POPs in past but didn't like them, tried [place] 8/19 but it spontaneously expelled a month later. Didn't have any relief with lysteda.   Sex activity: single partner, contraception - hyst, no vag pain/dryness. Last Pap: 03/25/17  Results were: no abnormalities /neg HPV DNA  Hx of STDs: none   Last mammogram: 03/26/21  Results were: normal--routine follow-up in 12 months There is no FH of breast cancer. There is no FH of ovarian cancer. The patient does do self-breast exams.   Tobacco use: social Alcohol use: social Drug use: none Exercise: moderately active  Colonoscopy: never   She does get adequate calcium but not Vitamin D in her diet.   Meds and labs followed by PCP.   Past Medical History:  Diagnosis Date   Abnormal Pap smear of cervix    Anxiety 2020   Asthma    seasonal   Depression 2020   Elevated TSH    GERD (gastroesophageal reflux disease)    Hemorrhoid    Hemorrhoids    Hyperlipidemia    Hypertension    Hypothyroidism    Kidney stone 2001   Spontaneous abortion     Past Surgical History:  Procedure Laterality Date   ANAL FISSURECTOMY  2014   ANAL SPHINCTEROTOMY  2014   CESAREAN SECTION     leap     TOTAL LAPAROSCOPIC HYSTERECTOMY WITH SALPINGECTOMY Bilateral 08/09/2020   Procedure: TOTAL LAPAROSCOPIC HYSTERECTOMY WITH SALPINGECTOMY;  Surgeon: Gae Dry, MD;  Location: [facility] ORS;  Service: Gynecology;  Laterality: Bilateral;    Family History  Problem Relation Age of Onset   Diabetes Father    Hypothyroidism Mother     Uterine cancer Maternal Aunt 60       has contact   Breast cancer Neg Hx     Social History   Socioeconomic History   Marital status: Married    Spouse name: Not on file   Number of children: 1   Years of education: 16   Highest education level: Not on file  Occupational History   Occupation: Pharmacist, hospital    Comment: [employer]  Tobacco Use   Smoking status: Former    Types: Cigarettes    Quit date: 09/29/2004    Years since quitting: 16.6   Smokeless tobacco: Never  Vaping Use   Vaping Use: Never used  Substance and Sexual Activity   Alcohol use: Yes    Comment: occasionally   Drug use: No   Sexual activity: Yes    Birth control/protection: Surgical    Comment: Hysterectomy  Other Topics Concern   Not on file  Social History Narrative   Not on file   Social Determinants of Health   Financial Resource Strain: Not on file  Food Insecurity: Not on file  Transportation Needs: Not on file  Physical Activity: Not on file  Stress: Not on file  Social Connections: Not on file  Intimate Partner Violence: Not on file    Current Outpatient Medications on File  Prior to Visit  Medication Sig Dispense Refill   cetirizine (ZYRTEC) 10 MG tablet Take 10 mg by mouth daily as needed.      escitalopram (LEXAPRO) 20 MG tablet Take 20 mg by mouth daily.      LORazepam (ATIVAN) 0.5 MG tablet TAKE 1 TABLET(0.5 MG) BY MOUTH EVERY 8 HOURS AS NEEDED FOR ANXIETY 30 tablet 0   losartan (COZAAR) 25 MG tablet Take 25 mg by mouth daily.   3   levothyroxine (SYNTHROID) 50 MCG tablet Take 50 mcg by mouth daily before breakfast.      No current facility-administered medications on file prior to visit.      ROS:  Review of Systems  Constitutional:  Negative for fatigue, fever and unexpected weight change.  Respiratory:  Negative for cough, shortness of breath and wheezing.   Cardiovascular:  Negative for chest pain, palpitations and leg swelling.  Gastrointestinal:  Negative for blood in  stool, constipation, diarrhea, nausea and vomiting.  Endocrine: Negative for cold intolerance, heat intolerance and polyuria.  Genitourinary:  Negative for dyspareunia, dysuria, flank pain, frequency, genital sores, hematuria, menstrual problem, pelvic pain, urgency, vaginal bleeding, vaginal discharge and vaginal pain.  Musculoskeletal:  Negative for back pain, joint swelling and myalgias.  Skin:  Negative for rash.  Neurological:  Negative for dizziness, syncope, light-headedness, numbness and headaches.  Hematological:  Negative for adenopathy.  Psychiatric/Behavioral:  Negative for agitation, confusion, sleep disturbance and suicidal ideas. The patient is not nervous/anxious.    Objective: BP 124/80   Ht '5\' 2"'$  (1.575 m)   Wt 180 lb (81.6 kg)   LMP 05/13/2020 (Approximate)   BMI 32.92 kg/m    Physical Exam Constitutional:      Appearance: She is well-developed.  Genitourinary:     Vulva normal.     Genitourinary Comments: UTERUS/CX SURG REM     Right Labia: No rash, tenderness or lesions.    Left Labia: No tenderness, lesions or rash.    Vaginal cuff intact.    No vaginal discharge, erythema or tenderness.      Right Adnexa: not tender and no mass present.    Left Adnexa: not tender and no mass present.    Cervix is absent.     Uterus is absent.  Breasts:    Right: No mass, nipple discharge, skin change or tenderness.     Left: No mass, nipple discharge, skin change or tenderness.  Neck:     Thyroid: No thyromegaly.  Cardiovascular:     Rate and Rhythm: Normal rate and regular rhythm.     Heart sounds: Normal heart sounds. No murmur heard. Pulmonary:     Effort: Pulmonary effort is normal.     Breath sounds: Normal breath sounds.  Abdominal:     Palpations: Abdomen is soft.     Tenderness: There is no abdominal tenderness. There is no guarding.  Musculoskeletal:        General: Normal range of motion.     Cervical back: Normal range of motion.  Neurological:      General: No focal deficit present.     Mental Status: She is alert and oriented to person, place, and time.     Cranial Nerves: No cranial nerve deficit.  Skin:    General: Skin is warm and dry.  Psychiatric:        Mood and Affect: Mood normal.        Behavior: Behavior normal.        Thought Content:  Thought content normal.        Judgment: Judgment normal.  Vitals reviewed.   Assessment/Plan:  Encounter for annual routine gynecological examination  Encounter for screening mammogram for malignant neoplasm of breast - Plan: MM 3D SCREEN BREAST BILATERAL; pt to sched mammo 6/23  S/P laparoscopic hysterectomy--doing well  Screening for colon cancer - Plan: Cologuard; colonoscopy/cologuard discussed. Pt elects cologuard. Ref sent. Will f/u with results.           GYN counsel breast self exam, mammography screening, adequate intake of calcium and vitamin D, diet and exercise     F/U  Return in about 1 year (around 05/29/2022).  Rae Plotner B. Corian Handley, PA-C 05/29/2021 4:43 PM

## 2021-05-29 NOTE — Patient Instructions (Addendum)
I value your feedback and you entrusting us with your care. If you get a  patient survey, I would appreciate you taking the time to let us know about your experience today. Thank you!  [facility]: [phone]      

## 2021-07-30 ENCOUNTER — Telehealth: Payer: Self-pay

## 2021-07-30 NOTE — Telephone Encounter (Signed)
Per ABC, called pt to f/u on Cologuard test, is she still planning to complete test? Pt says yes, hasn't had the time yet.

## 2022-03-10 ENCOUNTER — Other Ambulatory Visit: Payer: Self-pay | Admitting: Obstetrics and Gynecology

## 2022-03-10 DIAGNOSIS — Z1231 Encounter for screening mammogram for malignant neoplasm of breast: Secondary | ICD-10-CM

## 2022-04-03 ENCOUNTER — Ambulatory Visit
Admission: RE | Admit: 2022-04-03 | Discharge: 2022-04-03 | Disposition: A | Discharge status: Home / Self Care | Payer: BC Managed Care – PPO | Source: Ambulatory Visit | Attending: Obstetrics and Gynecology | Admitting: Obstetrics and Gynecology

## 2022-04-03 DIAGNOSIS — Z1231 Encounter for screening mammogram for malignant neoplasm of breast: Secondary | ICD-10-CM | POA: Diagnosis present

## 2022-05-27 ENCOUNTER — Telehealth: Payer: Self-pay | Admitting: Obstetrics and Gynecology

## 2022-05-27 NOTE — Telephone Encounter (Signed)
Reached out to the patient to RS her appt she has on 07-03-2022 with ABC. Pt can be rs on the 06-30-2022.

## 2022-06-09 NOTE — Telephone Encounter (Signed)
Patient is rescheduled for 08/12/22 with ABC

## 2022-06-10 ENCOUNTER — Ambulatory Visit: Payer: BC Managed Care – PPO | Admitting: Obstetrics and Gynecology

## 2022-07-03 ENCOUNTER — Ambulatory Visit: Payer: BC Managed Care – PPO | Admitting: Obstetrics and Gynecology

## 2022-08-12 ENCOUNTER — Ambulatory Visit: Payer: BC Managed Care – PPO | Admitting: Obstetrics and Gynecology

## 2022-09-08 NOTE — Progress Notes (Unsigned)
No chief complaint on file.   HPI:      [name] is a 46 y.o. G2P1011 who LMP was Patient's last menstrual period was 05/13/2020 (approximate)., presents today for her annual examination.  Her menses are absent due to [facility] BS for abnormal uterine bleeding 11/21 with Dr. Kenton Kingfisher. Hx of AUB and menorrhagia last yr. Doing well.  Neg EMB 6/21 after thickened EM on u/s. Did POPs in past but didn't like them, tried [place] 8/19 but it spontaneously expelled a month later. Didn't have any relief with lysteda.   Sex activity: single partner, contraception - hyst, no vag pain/dryness. Last Pap: 03/25/17  Results were: no abnormalities /neg HPV DNA; neg surg path after hyst 11/21 Hx of STDs: none   Last mammogram: 04/03/22  Results were: normal--routine follow-up in 12 months There is no FH of breast cancer. There is no FH of ovarian cancer. The patient does do self-breast exams.   Tobacco use: social Alcohol use: social Drug use: none Exercise: moderately active  Colonoscopy: never; cologuard ref sent last yr   She does get adequate calcium but not Vitamin D in her diet.   Meds and labs followed by PCP.   Past Medical History:  Diagnosis Date   Abnormal Pap smear of cervix    Anxiety 2020   Asthma    seasonal   Depression 2020   Elevated TSH    GERD (gastroesophageal reflux disease)    Hemorrhoid    Hemorrhoids    Hyperlipidemia    Hypertension    Hypothyroidism    Kidney stone 2001   Spontaneous abortion     Past Surgical History:  Procedure Laterality Date   ANAL FISSURECTOMY  2014   ANAL SPHINCTEROTOMY  2014   CESAREAN SECTION     leap     TOTAL LAPAROSCOPIC HYSTERECTOMY WITH SALPINGECTOMY Bilateral 08/09/2020   Procedure: TOTAL LAPAROSCOPIC HYSTERECTOMY WITH SALPINGECTOMY;  Surgeon: Gae Dry, MD;  Location: [facility] ORS;  Service: Gynecology;  Laterality: Bilateral;    Family History  Problem Relation Age of Onset   Diabetes Father    Hypothyroidism  Mother    Uterine cancer Maternal Aunt 60       has contact   Breast cancer Neg Hx     Social History   Socioeconomic History   Marital status: Married    Spouse name: Not on file   Number of children: 1   Years of education: 16   Highest education level: Not on file  Occupational History   Occupation: Pharmacist, hospital    Comment: [employer]  Tobacco Use   Smoking status: Former    Types: Cigarettes    Quit date: 09/29/2004    Years since quitting: 17.9   Smokeless tobacco: Never  Vaping Use   Vaping Use: Never used  Substance and Sexual Activity   Alcohol use: Yes    Comment: occasionally   Drug use: No   Sexual activity: Yes    Birth control/protection: Surgical    Comment: Hysterectomy  Other Topics Concern   Not on file  Social History Narrative   Not on file   Social Determinants of Health   Financial Resource Strain: Not on file  Food Insecurity: Not on file  Transportation Needs: Not on file  Physical Activity: Not on file  Stress: Not on file  Social Connections: Not on file  Intimate Partner Violence: Not on file    Current Outpatient Medications on File Prior  to Visit  Medication Sig Dispense Refill   cetirizine (ZYRTEC) 10 MG tablet Take 10 mg by mouth daily as needed.      escitalopram (LEXAPRO) 20 MG tablet Take 20 mg by mouth daily.      levothyroxine (SYNTHROID) 50 MCG tablet Take 50 mcg by mouth daily before breakfast.      LORazepam (ATIVAN) 0.5 MG tablet TAKE 1 TABLET(0.5 MG) BY MOUTH EVERY 8 HOURS AS NEEDED FOR ANXIETY 30 tablet 0   losartan (COZAAR) 25 MG tablet Take 25 mg by mouth daily.   3   No current facility-administered medications on file prior to visit.      ROS:  Review of Systems  Constitutional:  Negative for fatigue, fever and unexpected weight change.  Respiratory:  Negative for cough, shortness of breath and wheezing.   Cardiovascular:  Negative for chest pain, palpitations and leg swelling.  Gastrointestinal:  Negative  for blood in stool, constipation, diarrhea, nausea and vomiting.  Endocrine: Negative for cold intolerance, heat intolerance and polyuria.  Genitourinary:  Negative for dyspareunia, dysuria, flank pain, frequency, genital sores, hematuria, menstrual problem, pelvic pain, urgency, vaginal bleeding, vaginal discharge and vaginal pain.  Musculoskeletal:  Negative for back pain, joint swelling and myalgias.  Skin:  Negative for rash.  Neurological:  Negative for dizziness, syncope, light-headedness, numbness and headaches.  Hematological:  Negative for adenopathy.  Psychiatric/Behavioral:  Negative for agitation, confusion, sleep disturbance and suicidal ideas. The patient is not nervous/anxious.     Objective: LMP 05/13/2020 (Approximate)    Physical Exam Constitutional:      Appearance: She is well-developed.  Genitourinary:     Vulva normal.     Genitourinary Comments: UTERUS/CX SURG REM     Right Labia: No rash, tenderness or lesions.    Left Labia: No tenderness, lesions or rash.    Vaginal cuff intact.    No vaginal discharge, erythema or tenderness.      Right Adnexa: not tender and no mass present.    Left Adnexa: not tender and no mass present.    Cervix is absent.     Uterus is absent.  Breasts:    Right: No mass, nipple discharge, skin change or tenderness.     Left: No mass, nipple discharge, skin change or tenderness.  Neck:     Thyroid: No thyromegaly.  Cardiovascular:     Rate and Rhythm: Normal rate and regular rhythm.     Heart sounds: Normal heart sounds. No murmur heard. Pulmonary:     Effort: Pulmonary effort is normal.     Breath sounds: Normal breath sounds.  Abdominal:     Palpations: Abdomen is soft.     Tenderness: There is no abdominal tenderness. There is no guarding.  Musculoskeletal:        General: Normal range of motion.     Cervical back: Normal range of motion.  Neurological:     General: No focal deficit present.     Mental Status: She is  alert and oriented to person, place, and time.     Cranial Nerves: No cranial nerve deficit.  Skin:    General: Skin is warm and dry.  Psychiatric:        Mood and Affect: Mood normal.        Behavior: Behavior normal.        Thought Content: Thought content normal.        Judgment: Judgment normal.  Vitals reviewed.    Assessment/Plan:  Encounter for  annual routine gynecological examination  Encounter for screening mammogram for malignant neoplasm of breast - Plan: MM 3D SCREEN BREAST BILATERAL; pt to sched mammo 6/23  S/P laparoscopic hysterectomy--doing well  Screening for colon cancer - Plan: Cologuard; colonoscopy/cologuard discussed. Pt elects cologuard. Ref sent. Will f/u with results.           GYN counsel breast self exam, mammography screening, adequate intake of calcium and vitamin D, diet and exercise     F/U  No follow-ups on file.  Woods Gangemi B. Deshone Lyssy, PA-C 09/08/2022 5:02 PM

## 2022-09-09 ENCOUNTER — Encounter: Payer: Self-pay | Admitting: Obstetrics and Gynecology

## 2022-09-09 ENCOUNTER — Other Ambulatory Visit: Payer: Self-pay

## 2022-09-09 ENCOUNTER — Telehealth: Payer: Self-pay

## 2022-09-09 ENCOUNTER — Ambulatory Visit (INDEPENDENT_AMBULATORY_CARE_PROVIDER_SITE_OTHER): Payer: BC Managed Care – PPO | Admitting: Obstetrics and Gynecology

## 2022-09-09 VITALS — BP 110/70 | Ht 62.0 in | Wt 181.0 lb

## 2022-09-09 DIAGNOSIS — N3946 Mixed incontinence: Secondary | ICD-10-CM

## 2022-09-09 DIAGNOSIS — Z01419 Encounter for gynecological examination (general) (routine) without abnormal findings: Secondary | ICD-10-CM

## 2022-09-09 DIAGNOSIS — Z1211 Encounter for screening for malignant neoplasm of colon: Secondary | ICD-10-CM

## 2022-09-09 DIAGNOSIS — Z1231 Encounter for screening mammogram for malignant neoplasm of breast: Secondary | ICD-10-CM

## 2022-09-09 MED ORDER — NA SULFATE-K SULFATE-MG SULF 17.5-3.13-1.6 GM/177ML PO SOLN: 1.0000 | Freq: Once | ORAL | 0 refills | Status: AC

## 2022-09-09 NOTE — Patient Instructions (Signed)
I value your feedback and you entrusting us with your care. If you get a [facility] patient survey, I would appreciate you taking the time to let us know about your experience today. Thank you!  [facility]: [phone]      

## 2022-09-09 NOTE — Telephone Encounter (Signed)
Gastroenterology Pre-Procedure Review  Request Date: 11/03/22 Requesting Physician: Dr. Marius Ditch  PATIENT REVIEW QUESTIONS: The patient responded to the following health history questions as indicated:    1. Are you having any GI issues? no 2. Do you have a personal history of Polyps? no 3. Do you have a family history of Colon Cancer or Polyps? no 4. Diabetes Mellitus? no 5. Joint replacements in the past 12 months?no 6. Major health problems in the past 3 months?no 7. Any artificial heart valves, MVP, or defibrillator?no    MEDICATIONS & ALLERGIES:    Patient reports the following regarding taking any anticoagulation/antiplatelet therapy:   Plavix, Coumadin, Eliquis, Xarelto, Lovenox, Pradaxa, Brilinta, or Effient? no Aspirin? no  Patient confirms/reports the following medications:  Current Outpatient Medications  Medication Sig Dispense Refill   cetirizine (ZYRTEC) 10 MG tablet Take 10 mg by mouth daily as needed.      escitalopram (LEXAPRO) 20 MG tablet Take 20 mg by mouth daily.      fluticasone (FLONASE) 50 MCG/ACT nasal spray SHAKE LIQUID AND USE 1 SPRAY IN EACH NOSTRIL TWICE DAILY     levothyroxine (SYNTHROID) 75 MCG tablet Take by mouth.     LORazepam (ATIVAN) 0.5 MG tablet TAKE 1 TABLET(0.5 MG) BY MOUTH EVERY 8 HOURS AS NEEDED FOR ANXIETY 30 tablet 0   losartan (COZAAR) 25 MG tablet Take 25 mg by mouth daily.   3   No current facility-administered medications for this visit.    Patient confirms/reports the following allergies:  Allergies  Allergen Reactions   Clarithromycin Other (See Comments)    Makes pt drool uncontrollably    No orders of the defined types were placed in this encounter.   AUTHORIZATION INFORMATION Primary Insurance: 1D#: Group #:  Secondary Insurance: 1D#: Group #:  SCHEDULE INFORMATION: Date: 11/03/22 Time: Location: [facility]

## 2022-10-28 ENCOUNTER — Telehealth: Payer: Self-pay

## 2022-10-28 NOTE — Telephone Encounter (Signed)
Returned patients call.  Left voice message for her to tell her how to view her instructions in her mychart.  Asked her to call me back if she was not able to see them.  Thanks, [place], [place]

## 2022-10-28 NOTE — Telephone Encounter (Signed)
Patient left a voicemail on my phone because she states she has not receive the instructions for her colonoscopy on Monday. 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

## 2022-11-03 ENCOUNTER — Encounter
Admission: RE | Disposition: A | Discharge status: Home / Self Care | Payer: Self-pay | Source: Home / Self Care | Attending: Gastroenterology

## 2022-11-03 ENCOUNTER — Ambulatory Visit: Payer: BC Managed Care – PPO | Admitting: Certified Registered"

## 2022-11-03 ENCOUNTER — Encounter: Payer: Self-pay | Admitting: Gastroenterology

## 2022-11-03 ENCOUNTER — Ambulatory Visit
Admission: RE | Admit: 2022-11-03 | Discharge: 2022-11-03 | Disposition: A | Discharge status: Home / Self Care | Payer: BC Managed Care – PPO | Attending: Gastroenterology | Admitting: Gastroenterology

## 2022-11-03 DIAGNOSIS — E039 Hypothyroidism, unspecified: Secondary | ICD-10-CM | POA: Insufficient documentation

## 2022-11-03 DIAGNOSIS — Z87891 Personal history of nicotine dependence: Secondary | ICD-10-CM | POA: Diagnosis not present

## 2022-11-03 DIAGNOSIS — I10 Essential (primary) hypertension: Secondary | ICD-10-CM | POA: Diagnosis not present

## 2022-11-03 DIAGNOSIS — K635 Polyp of colon: Secondary | ICD-10-CM | POA: Diagnosis not present

## 2022-11-03 DIAGNOSIS — K5730 Diverticulosis of large intestine without perforation or abscess without bleeding: Secondary | ICD-10-CM | POA: Insufficient documentation

## 2022-11-03 DIAGNOSIS — D120 Benign neoplasm of cecum: Secondary | ICD-10-CM | POA: Diagnosis not present

## 2022-11-03 DIAGNOSIS — F418 Other specified anxiety disorders: Secondary | ICD-10-CM | POA: Diagnosis not present

## 2022-11-03 DIAGNOSIS — D124 Benign neoplasm of descending colon: Secondary | ICD-10-CM | POA: Insufficient documentation

## 2022-11-03 DIAGNOSIS — D125 Benign neoplasm of sigmoid colon: Secondary | ICD-10-CM | POA: Insufficient documentation

## 2022-11-03 DIAGNOSIS — Z1211 Encounter for screening for malignant neoplasm of colon: Secondary | ICD-10-CM

## 2022-11-03 DIAGNOSIS — D122 Benign neoplasm of ascending colon: Secondary | ICD-10-CM | POA: Insufficient documentation

## 2022-11-03 HISTORY — PX: COLONOSCOPY WITH PROPOFOL: SHX5780

## 2022-11-03 HISTORY — DX: Personal history of urinary calculi: Z87.442

## 2022-11-03 SURGERY — COLONOSCOPY WITH PROPOFOL
Anesthesia: General

## 2022-11-03 MED ORDER — PROPOFOL 10 MG/ML IV BOLUS
INTRAVENOUS | Status: DC | PRN
  Administered 2022-11-03: 70 mg via INTRAVENOUS
  Administered 2022-11-03: 10 mg via INTRAVENOUS
  Administered 2022-11-03: 20 mg via INTRAVENOUS

## 2022-11-03 MED ORDER — MIDAZOLAM HCL 2 MG/2ML IJ SOLN
INTRAMUSCULAR | Status: DC | PRN
  Administered 2022-11-03: 2 mg via INTRAVENOUS

## 2022-11-03 MED ORDER — PROPOFOL 500 MG/50ML IV EMUL
INTRAVENOUS | Status: DC | PRN
  Administered 2022-11-03: 165 ug/kg/min via INTRAVENOUS

## 2022-11-03 MED ORDER — EPHEDRINE SULFATE (PRESSORS) 50 MG/ML IJ SOLN
INTRAMUSCULAR | Status: DC | PRN
  Administered 2022-11-03: 10 mg via INTRAVENOUS

## 2022-11-03 MED ORDER — SODIUM CHLORIDE 0.9 % IV SOLN: INTRAVENOUS | Status: DC | PRN

## 2022-11-03 MED ORDER — MIDAZOLAM HCL 2 MG/2ML IJ SOLN
INTRAMUSCULAR | Status: AC
  Filled 2022-11-03: qty 2

## 2022-11-03 MED ORDER — SODIUM CHLORIDE 0.9 % IV SOLN
INTRAVENOUS | Status: DC
  Administered 2022-11-03: 1000 mL via INTRAVENOUS

## 2022-11-03 MED ORDER — LIDOCAINE HCL (CARDIAC) PF 100 MG/5ML IV SOSY
PREFILLED_SYRINGE | INTRAVENOUS | Status: DC | PRN
  Administered 2022-11-03: 100 mg via INTRAVENOUS

## 2022-11-03 NOTE — H&P (Signed)
Jacqueline Darby, MD [address]  [place]  [place], [place] [address]  Main: [phone]  Fax: [phone] Pager: [phone]  Primary Care Physician:  Maryland Pink, MD Primary Gastroenterologist:  Dr. Cephas Montoya  Pre-Procedure History & Physical: HPI:  Jacqueline Montoya is a 47 y.o. female is here for an colonoscopy.   Past Medical History:  Diagnosis Date   Abnormal Pap smear of cervix    Anxiety 2020   Asthma    seasonal   Depression 2020   Elevated TSH    GERD (gastroesophageal reflux disease)    Hemorrhoid    Hemorrhoids    History of kidney stones    Hyperlipidemia    Hypertension    Hypothyroidism    Spontaneous abortion     Past Surgical History:  Procedure Laterality Date   ABDOMINAL HYSTERECTOMY     ANAL FISSURECTOMY  09/29/2012   ANAL SPHINCTEROTOMY  09/29/2012   CESAREAN SECTION     leap     TOTAL LAPAROSCOPIC HYSTERECTOMY WITH SALPINGECTOMY Bilateral 08/09/2020   Procedure: TOTAL LAPAROSCOPIC HYSTERECTOMY WITH SALPINGECTOMY;  Surgeon: Gae Dry, MD;  Location: [facility] ORS;  Service: Gynecology;  Laterality: Bilateral;    Prior to Admission medications   Medication Sig Start Date End Date Taking? Authorizing Provider  cetirizine (ZYRTEC) 10 MG tablet Take 10 mg by mouth daily as needed.    Yes [provider]  escitalopram (LEXAPRO) 20 MG tablet Take 20 mg by mouth daily.  03/04/19  Yes [provider]  fluticasone (FLONASE) 50 MCG/ACT nasal spray SHAKE LIQUID AND USE 1 SPRAY IN EACH NOSTRIL TWICE DAILY 02/12/22  Yes [provider]  levothyroxine (SYNTHROID) 75 MCG tablet Take by mouth. 08/01/22 08/01/23 Yes [provider]  LORazepam (ATIVAN) 0.5 MG tablet TAKE 1 TABLET(0.5 MG) BY MOUTH EVERY 8 HOURS AS NEEDED FOR ANXIETY 5/00/93  Yes Copland, Alicia B, PA-C  losartan (COZAAR) 25 MG tablet Take 25 mg by mouth daily.  02/10/18  Yes [provider]    Allergies as of 09/10/2022 - Review Complete  09/09/2022  Allergen Reaction Noted   Clarithromycin Other (See Comments) 09/26/2015    Family History  Problem Relation Age of Onset   Diabetes Father    Hypothyroidism Mother    Uterine cancer Maternal Aunt 60       has contact   Breast cancer Neg Hx     Social History   Socioeconomic History   Marital status: Married    Spouse name: Not on file   Number of children: 1   Years of education: 16   Highest education level: Not on file  Occupational History   Occupation: Pharmacist, hospital    Comment: [employer]  Tobacco Use   Smoking status: Former    Types: Cigarettes    Quit date: 09/29/2004    Years since quitting: 18.1   Smokeless tobacco: Never  Vaping Use   Vaping Use: Never used  Substance and Sexual Activity   Alcohol use: Yes    Comment: occasionally   Drug use: No   Sexual activity: Yes    Birth control/protection: Surgical    Comment: Hysterectomy  Other Topics Concern   Not on file  Social History Narrative   Not on file   Social Determinants of Health   Financial Resource Strain: Not on file  Food Insecurity: Not on file  Transportation Needs: Not on file  Physical Activity: Not on file  Stress: Not on file  Social Connections: Not on file  Intimate Partner Violence: Not on file    Review of Systems: See HPI, otherwise negative ROS  Physical Exam: BP (!) 132/113   Pulse 65   Temp (!) 96.5 F (35.8 C)   Resp 18   Ht '5\' 2"'$  (1.575 m)   Wt 80.4 kg   LMP 05/13/2020 (Approximate)   SpO2 98%   BMI 32.44 kg/m  General:   Alert,  pleasant and cooperative in NAD Head:  Normocephalic and atraumatic. Neck:  Supple; no masses or thyromegaly. Lungs:  Clear throughout to auscultation.    Heart:  Regular rate and rhythm. Abdomen:  Soft, nontender and nondistended. Normal bowel sounds, without guarding, and without rebound.   Neurologic:  Alert and  oriented x4;  grossly normal neurologically.  Impression/Plan: Jacqueline Montoya is here for an colonoscopy  to be performed for colon cancer screening  Risks, benefits, limitations, and alternatives regarding  colonoscopy have been reviewed with the patient.  Questions have been answered.  All parties agreeable.   Sherri Sear, MD  11/03/2022, 8:25 AM

## 2022-11-03 NOTE — Anesthesia Preprocedure Evaluation (Signed)
Anesthesia Evaluation  Patient identified by MRN, date of birth, ID band Patient awake    Reviewed: Allergy & Precautions, H&P , NPO status , Patient's Chart, lab work & pertinent test results, reviewed documented beta blocker date and time   Airway Mallampati: II   Neck ROM: full    Dental  (+) Poor Dentition   Pulmonary asthma , former smoker   Pulmonary exam normal        Cardiovascular hypertension, On Medications negative cardio ROS Normal cardiovascular exam Rhythm:regular Rate:Normal     Neuro/Psych   Anxiety Depression    negative neurological ROS  negative psych ROS   GI/Hepatic Neg liver ROS,GERD  Medicated,,  Endo/Other  Hypothyroidism    Renal/GU negative Renal ROS  negative genitourinary   Musculoskeletal   Abdominal   Peds  Hematology negative hematology ROS (+)   Anesthesia Other Findings Past Medical History: No date: Abnormal Pap smear of cervix 2020: Anxiety No date: Asthma     Comment:  seasonal 2020: Depression No date: Elevated TSH No date: GERD (gastroesophageal reflux disease) No date: Hemorrhoid No date: Hemorrhoids No date: History of kidney stones No date: Hyperlipidemia No date: Hypertension No date: Hypothyroidism No date: Spontaneous abortion Past Surgical History: No date: ABDOMINAL HYSTERECTOMY 09/29/2012: ANAL FISSURECTOMY 09/29/2012: ANAL SPHINCTEROTOMY No date: CESAREAN SECTION No date: leap 08/09/2020: TOTAL LAPAROSCOPIC HYSTERECTOMY WITH SALPINGECTOMY;  Bilateral     Comment:  Procedure: TOTAL LAPAROSCOPIC HYSTERECTOMY WITH               SALPINGECTOMY;  Surgeon: Gae Dry, MD;  Location:              [facility] ORS;  Service: Gynecology;  Laterality: Bilateral; BMI    Body Mass Index: 32.44 kg/m     Reproductive/Obstetrics negative OB ROS                             Anesthesia Physical Anesthesia Plan  ASA: 2  Anesthesia Plan:  General   Post-op Pain Management:    Induction:   PONV Risk Score and Plan: 3  Airway Management Planned:   Additional Equipment:   Intra-op Plan:   Post-operative Plan:   Informed Consent: I have reviewed the patients History and Physical, chart, labs and discussed the procedure including the risks, benefits and alternatives for the proposed anesthesia with the patient or authorized representative who has indicated his/her understanding and acceptance.     Dental Advisory Given  Plan Discussed with: CRNA  Anesthesia Plan Comments:        Anesthesia Quick Evaluation

## 2022-11-03 NOTE — Op Note (Signed)
[facility] Patient Name: Jacqueline Montoya Procedure Date: 11/03/2022 9:16 AM MRN: [id number] Account #: [id number] Date of Birth: 09-22-1976 Admit Type: Outpatient Age: 47 Room: [facility] ENDO ROOM 4 Gender: Female Note Status: Finalized Instrument Name: Colonoscope [id number] Procedure:             Colonoscopy Indications:           Screening for colorectal malignant neoplasm Providers:             Lin Landsman MD, MD Referring MD:          Irven Easterly. Kary Kos, MD (Referring MD) Medicines:             General Anesthesia Complications:         No immediate complications. Estimated blood loss: None. Procedure:             Pre-Anesthesia Assessment:                        - Prior to the procedure, a History and Physical was                         performed, and patient medications and allergies were                         reviewed. The patient is competent. The risks and                         benefits of the procedure and the sedation options and                         risks were discussed with the patient. All questions                         were answered and informed consent was obtained.                         Patient identification and proposed procedure were                         verified by the physician, the nurse, the                         anesthesiologist, the anesthetist and the technician                         in the pre-procedure area in the procedure room in the                         endoscopy suite. Mental Status Examination: alert and                         oriented. Airway Examination: normal oropharyngeal                         airway and neck mobility. Respiratory Examination:                         clear to auscultation. CV Examination: normal.  Prophylactic Antibiotics: The patient does not require                         prophylactic antibiotics. Prior Anticoagulants: The                          patient has taken no anticoagulant or antiplatelet                         agents. ASA Grade Assessment: II - A patient with mild                         systemic disease. After reviewing the risks and                         benefits, the patient was deemed in satisfactory                         condition to undergo the procedure. The anesthesia                         plan was to use general anesthesia. Immediately prior                         to administration of medications, the patient was                         re-assessed for adequacy to receive sedatives. The                         heart rate, respiratory rate, oxygen saturations,                         blood pressure, adequacy of pulmonary ventilation, and                         response to care were monitored throughout the                         procedure. The physical status of the patient was                         re-assessed after the procedure.                        After obtaining informed consent, the colonoscope was                         passed under direct vision. Throughout the procedure,                         the patient's blood pressure, pulse, and oxygen                         saturations were monitored continuously. The                         Colonoscope was introduced through the anus and  advanced to the the terminal ileum, with                         identification of the appendiceal orifice and IC                         valve. The colonoscopy was performed without                         difficulty. The patient tolerated the procedure well.                         The quality of the bowel preparation was evaluated                         using the BBPS [facility] Bowel Preparation Scale) with                         scores of: Right Colon = 2 (minor amount of residual                         staining, small fragments of stool and/or opaque                         liquid, but  mucosa seen well), Transverse Colon = 2                         (minor amount of residual staining, small fragments of                         stool and/or opaque liquid, but mucosa seen well) and                         Left Colon = 2 (minor amount of residual staining,                         small fragments of stool and/or opaque liquid, but                         mucosa seen well). The total BBPS score equals 6. The                         terminal ileum, ileocecal valve, appendiceal orifice,                         and rectum were photographed. Findings:      The perianal and digital rectal examinations were normal. Pertinent       negatives include normal sphincter tone and no palpable rectal lesions.      The terminal ileum appeared normal.      The terminal ileum appeared normal.      A 4 mm polyp was found in the cecum. The polyp was sessile. The polyp       was removed with a cold snare. Resection and retrieval were complete.       Estimated blood loss: none.      Three sessile polyps were found in the ascending colon. The polyps were  4 to 5 mm in size. These polyps were removed with a cold snare.       Resection and retrieval were complete. Estimated blood loss: none.      Three sessile polyps were found in the descending colon. The polyps were       4 to 5 mm in size. These polyps were removed with a cold snare.       Resection and retrieval were complete. Estimated blood loss: none.      An 8 mm polyp was found in the sigmoid colon. The polyp was sessile. The       polyp was removed with a cold snare. Resection and retrieval were       complete. To prevent bleeding after the polypectomy, one hemostatic clip       was successfully placed (MR safe). Clip manufacturer: [employer].       There was no bleeding at the end of the procedure. Estimated blood loss       was minimal.      The retroflexed view of the distal rectum and anal verge was normal and       showed  no anal or rectal abnormalities.      Many diverticula were found in the entire colon. Impression:            - The examined portion of the ileum was normal.                        - The examined portion of the ileum was normal.                        - One 4 mm polyp in the cecum, removed with a cold                         snare. Resected and retrieved.                        - Three 4 to 5 mm polyps in the ascending colon,                         removed with a cold snare. Resected and retrieved.                        - Three 4 to 5 mm polyps in the descending colon,                         removed with a cold snare. Resected and retrieved.                        - One 8 mm polyp in the sigmoid colon, removed with a                         cold snare. Resected and retrieved. Clip manufacturer:                         [employer]. Clip (MR safe) was placed.                        - The distal rectum and anal verge are normal on  retroflexion view.                        - Diverticulosis in the entire examined colon. Recommendation:        - Discharge patient to home (with escort).                        - Resume previous diet today.                        - Continue present medications.                        - Await pathology results.                        - Repeat colonoscopy in 1 year for surveillance of                         multiple polyps. Procedure Code(s):     --- Professional ---                        [id number], Colonoscopy, flexible; with removal of                         tumor(s), polyp(s), or other lesion(s) by snare                         technique Diagnosis Code(s):     --- Professional ---                        Z12.11, Encounter for screening for malignant neoplasm                         of colon                        D12.0, Benign neoplasm of cecum                        D12.5, Benign neoplasm of sigmoid colon                         D12.2, Benign neoplasm of ascending colon                        D12.4, Benign neoplasm of descending colon                        K57.30, Diverticulosis of large intestine without                         perforation or abscess without bleeding CPT copyright 2022 American Medical Association. All rights reserved. The codes documented in this report are preliminary and upon coder review may  be revised to meet current compliance requirements. Dr. Ulyess Mort Lin Landsman MD, MD 11/03/2022 9:50:35 AM This report has been signed electronically. Number of Addenda: 0 Note Initiated On: 11/03/2022 9:16 AM Scope Withdrawal Time: 0 hours 20 minutes 8 seconds  Total Procedure Duration: 0 hours 23 minutes 44 seconds  Estimated Blood Loss:  Estimated blood loss: none.      [facility]

## 2022-11-03 NOTE — Transfer of Care (Signed)
Immediate Anesthesia Transfer of Care Note  Patient: Jacqueline Montoya  Procedure(s) Performed: COLONOSCOPY WITH PROPOFOL  Patient Location: Endoscopy Unit  Anesthesia Type:General  Level of Consciousness: drowsy and patient cooperative  Airway & Oxygen Therapy: Patient Spontanous Breathing and Patient connected to face mask oxygen  Post-op Assessment: Report given to RN and Post -op Vital signs reviewed and stable  Post vital signs: Reviewed and stable  Last Vitals:  Vitals Value Taken Time  BP 84/53 11/03/22 0955  Temp    Pulse 53 11/03/22 0956  Resp 14 11/03/22 0956  SpO2 100 % 11/03/22 0956  Vitals shown include unvalidated device data.  Last Pain:  Vitals:   11/03/22 0955  PainSc: Asleep         Complications: No notable events documented.

## 2022-11-03 NOTE — Anesthesia Procedure Notes (Signed)
Procedure Name: General with mask airway Date/Time: 11/03/2022 9:35 AM  Performed by: Kelton Pillar, CRNAPre-anesthesia Checklist: Patient identified, Emergency Drugs available, Suction available and Patient being monitored Patient Re-evaluated:Patient Re-evaluated prior to induction Oxygen Delivery Method: Simple face mask Induction Type: IV induction Placement Confirmation: positive ETCO2, CO2 detector and breath sounds checked- equal and bilateral Dental Injury: Teeth and Oropharynx as per pre-operative assessment

## 2022-11-04 ENCOUNTER — Encounter: Payer: Self-pay | Admitting: Gastroenterology

## 2022-11-04 LAB — SURGICAL PATHOLOGY

## 2022-11-05 ENCOUNTER — Encounter: Payer: Self-pay | Admitting: Gastroenterology

## 2022-11-10 NOTE — Anesthesia Postprocedure Evaluation (Signed)
Anesthesia Post Note  Patient: Jacqueline Montoya  Procedure(s) Performed: COLONOSCOPY WITH PROPOFOL  Patient location during evaluation: PACU Anesthesia Type: General Level of consciousness: awake and alert Pain management: pain level controlled Vital Signs Assessment: post-procedure vital signs reviewed and stable Respiratory status: spontaneous breathing, nonlabored ventilation, respiratory function stable and patient connected to nasal cannula oxygen Cardiovascular status: blood pressure returned to baseline and stable Postop Assessment: no apparent nausea or vomiting Anesthetic complications: no   No notable events documented.   Last Vitals:  Vitals:   11/03/22 1005 11/03/22 1015  BP: 127/66 120/69  Pulse: 70 68  Resp: 14 18  Temp:    SpO2: 100% 100%    Last Pain:  Vitals:   11/04/22 0752  PainSc: 0-No pain                 Molli Barrows

## 2023-04-14 ENCOUNTER — Ambulatory Visit
Admission: RE | Admit: 2023-04-14 | Discharge: 2023-04-14 | Disposition: A | Discharge status: Home / Self Care | Payer: BC Managed Care – PPO | Source: Ambulatory Visit | Attending: Obstetrics and Gynecology | Admitting: Obstetrics and Gynecology

## 2023-04-14 DIAGNOSIS — Z1231 Encounter for screening mammogram for malignant neoplasm of breast: Secondary | ICD-10-CM | POA: Diagnosis present

## 2023-04-15 ENCOUNTER — Other Ambulatory Visit: Payer: Self-pay | Admitting: Obstetrics and Gynecology

## 2023-04-15 DIAGNOSIS — N6489 Other specified disorders of breast: Secondary | ICD-10-CM

## 2023-04-15 DIAGNOSIS — N63 Unspecified lump in unspecified breast: Secondary | ICD-10-CM

## 2023-04-15 DIAGNOSIS — R928 Other abnormal and inconclusive findings on diagnostic imaging of breast: Secondary | ICD-10-CM

## 2023-04-21 ENCOUNTER — Ambulatory Visit
Admission: RE | Admit: 2023-04-21 | Discharge: 2023-04-21 | Disposition: A | Discharge status: Home / Self Care | Payer: BC Managed Care – PPO | Source: Ambulatory Visit | Attending: Obstetrics and Gynecology | Admitting: Obstetrics and Gynecology

## 2023-04-21 DIAGNOSIS — N63 Unspecified lump in unspecified breast: Secondary | ICD-10-CM

## 2023-04-21 DIAGNOSIS — N6489 Other specified disorders of breast: Secondary | ICD-10-CM | POA: Diagnosis present

## 2023-04-21 DIAGNOSIS — R928 Other abnormal and inconclusive findings on diagnostic imaging of breast: Secondary | ICD-10-CM | POA: Diagnosis not present

## 2023-04-23 ENCOUNTER — Encounter: Payer: Self-pay | Admitting: Obstetrics and Gynecology

## 2023-09-14 ENCOUNTER — Ambulatory Visit: Payer: BC Managed Care – PPO | Admitting: Obstetrics and Gynecology

## 2023-09-17 ENCOUNTER — Encounter: Payer: Self-pay | Admitting: Obstetrics and Gynecology

## 2023-09-17 ENCOUNTER — Other Ambulatory Visit (HOSPITAL_COMMUNITY)
Admission: RE | Admit: 2023-09-17 | Discharge: 2023-09-17 | Disposition: A | Discharge status: Home / Self Care | Payer: BC Managed Care – PPO | Source: Ambulatory Visit | Attending: Obstetrics and Gynecology | Admitting: Obstetrics and Gynecology

## 2023-09-17 ENCOUNTER — Ambulatory Visit (INDEPENDENT_AMBULATORY_CARE_PROVIDER_SITE_OTHER): Payer: BC Managed Care – PPO | Admitting: Obstetrics and Gynecology

## 2023-09-17 VITALS — BP 106/73 | HR 73 | Ht 62.0 in | Wt 179.0 lb

## 2023-09-17 DIAGNOSIS — Z01419 Encounter for gynecological examination (general) (routine) without abnormal findings: Secondary | ICD-10-CM | POA: Diagnosis not present

## 2023-09-17 DIAGNOSIS — Z780 Asymptomatic menopausal state: Secondary | ICD-10-CM

## 2023-09-17 DIAGNOSIS — Z1231 Encounter for screening mammogram for malignant neoplasm of breast: Secondary | ICD-10-CM

## 2023-09-17 DIAGNOSIS — Z1211 Encounter for screening for malignant neoplasm of colon: Secondary | ICD-10-CM

## 2023-09-17 DIAGNOSIS — N951 Menopausal and female climacteric states: Secondary | ICD-10-CM

## 2023-09-17 DIAGNOSIS — Z124 Encounter for screening for malignant neoplasm of cervix: Secondary | ICD-10-CM

## 2023-09-17 DIAGNOSIS — Z1151 Encounter for screening for human papillomavirus (HPV): Secondary | ICD-10-CM | POA: Insufficient documentation

## 2023-09-17 NOTE — Progress Notes (Signed)
Chief Complaint  Patient presents with   Gynecologic Exam    No concerns    HPI:      [name] is a 47 y.o. G2P1011 who LMP was Patient's last menstrual period was 05/13/2020 (approximate)., presents today for her annual examination.  Her menses are absent due to [facility] BS for abnormal uterine bleeding 11/21 with Dr. Tiburcio Pea, doing well. Neg EMB 6/21 after thickened EM on u/s. Having significant VS sx, body feels hot too touch. Wasn't euthyroid earlier in the year but has been since 9/24 due to dose change and still having sx.   Sex activity: single partner, contraception - hyst. Has occas vag dryness/pain, hasn't tried lubricants.  Last Pap: 03/25/17  Results were: no abnormalities /neg HPV DNA; neg surg path after hyst 11/21 Hx of STDs: none   Last mammogram: 04/21/23 Results were: normal after addl views LT breast--routine follow-up in 12 months There is no FH of breast cancer. There is no FH of ovarian cancer. The patient does do self-breast exams.   Tobacco use: 1/4 ppd Alcohol use: social Drug use: none Exercise: moderately active  Colonoscopy: 2/24 at [facility] with polyps, repeat due in 1 yr.   She does get adequate calcium and Vitamin D in her diet.   Meds and labs followed by PCP.   Past Medical History:  Diagnosis Date   Abnormal Pap smear of cervix    Anxiety 2020   Asthma    seasonal   Depression 2020   Elevated TSH    GERD (gastroesophageal reflux disease)    Hemorrhoid    Hemorrhoids    History of kidney stones    Hyperlipidemia    Hypertension    Hypothyroidism    Spontaneous abortion     Past Surgical History:  Procedure Laterality Date   ABDOMINAL HYSTERECTOMY     ANAL FISSURECTOMY  09/29/2012   ANAL SPHINCTEROTOMY  09/29/2012   CESAREAN SECTION     COLONOSCOPY WITH PROPOFOL N/A 11/03/2022   Procedure: COLONOSCOPY WITH PROPOFOL;  Surgeon: Toney Reil, MD;  Location: [facility] ENDOSCOPY;  Service: Gastroenterology;  Laterality: N/A;    leap     TOTAL LAPAROSCOPIC HYSTERECTOMY WITH SALPINGECTOMY Bilateral 08/09/2020   Procedure: TOTAL LAPAROSCOPIC HYSTERECTOMY WITH SALPINGECTOMY;  Surgeon: Nadara Mustard, MD;  Location: [facility] ORS;  Service: Gynecology;  Laterality: Bilateral;    Family History  Problem Relation Age of Onset   Diabetes Father    Hypothyroidism Mother    Uterine cancer Maternal Aunt 79       has contact   Breast cancer Neg Hx     Social History   Socioeconomic History   Marital status: Significant Other    Spouse name: Not on file   Number of children: 1   Years of education: 41   Highest education level: Not on file  Occupational History   Occupation: teacher    Comment: [employer]  Tobacco Use   Smoking status: Every Day    Current packs/day: 0.00    Types: Cigarettes    Last attempt to quit: 09/29/2004    Years since quitting: 18.9   Smokeless tobacco: Never  Vaping Use   Vaping status: Never Used  Substance and Sexual Activity   Alcohol use: Yes    Comment: occasionally   Drug use: No   Sexual activity: Yes    Birth control/protection: Surgical    Comment: Hysterectomy  Other Topics Concern   Not on file  Social History Narrative   Not on file   Social Drivers of Health   Financial Resource Strain: Low Risk  (06/25/2023)   Received from [facility]   Overall Financial Resource Strain (CARDIA)    Difficulty of Paying Living Expenses: Not very hard  Food Insecurity: No Food Insecurity (06/25/2023)   Received from [facility]   Hunger Vital Sign    Worried About Running Out of Food in the Last Year: Never true    Ran Out of Food in the Last Year: Never true  Transportation Needs: No Transportation Needs (06/25/2023)   Received from [facility] - Transportation    In the past 12 months, has lack of transportation kept you from medical appointments or from getting medications?: No    Lack of Transportation  (Non-Medical): No  Physical Activity: Not on file  Stress: Not on file  Social Connections: Not on file  Intimate Partner Violence: Not on file    Current Outpatient Medications on File Prior to Visit  Medication Sig Dispense Refill   cetirizine (ZYRTEC) 10 MG tablet Take 10 mg by mouth daily as needed.      escitalopram (LEXAPRO) 20 MG tablet Take 20 mg by mouth daily.      fluticasone (FLONASE) 50 MCG/ACT nasal spray SHAKE LIQUID AND USE 1 SPRAY IN EACH NOSTRIL TWICE DAILY     levothyroxine (SYNTHROID) 88 MCG tablet Take 88 mcg by mouth daily.     LORazepam (ATIVAN) 0.5 MG tablet TAKE 1 TABLET(0.5 MG) BY MOUTH EVERY 8 HOURS AS NEEDED FOR ANXIETY 30 tablet 0   losartan (COZAAR) 25 MG tablet Take 25 mg by mouth daily.   3   No current facility-administered medications on file prior to visit.      ROS:  Review of Systems  Constitutional:  Negative for fatigue, fever and unexpected weight change.  Respiratory:  Negative for cough, shortness of breath and wheezing.   Cardiovascular:  Negative for chest pain, palpitations and leg swelling.  Gastrointestinal:  Negative for blood in stool, constipation, diarrhea, nausea and vomiting.  Endocrine: Negative for cold intolerance, heat intolerance and polyuria.  Genitourinary:  Positive for dyspareunia. Negative for dysuria, flank pain, frequency, genital sores, hematuria, menstrual problem, pelvic pain, urgency, vaginal bleeding, vaginal discharge and vaginal pain.  Musculoskeletal:  Negative for back pain, joint swelling and myalgias.  Skin:  Negative for rash.  Neurological:  Negative for dizziness, syncope, light-headedness, numbness and headaches.  Hematological:  Negative for adenopathy.  Psychiatric/Behavioral:  Negative for agitation, confusion, sleep disturbance and suicidal ideas. The patient is not nervous/anxious.     Objective: BP 106/73   Pulse 73   Ht 5\' 2"  (1.575 m)   Wt 179 lb (81.2 kg)   LMP 05/13/2020 (Approximate)    BMI 32.74 kg/m    Physical Exam Constitutional:      Appearance: She is well-developed.  Genitourinary:     Vulva normal.     Genitourinary Comments: UTERUS/CX SURG REM     Right Labia: No rash, tenderness or lesions.    Left Labia: No tenderness, lesions or rash.    Vaginal cuff intact.    No vaginal discharge, erythema or tenderness.      Right Adnexa: not tender and no mass present.    Left Adnexa: not tender and no mass present.    Cervix is absent.     Uterus is absent.  Breasts:    Right:  No mass, nipple discharge, skin change or tenderness.     Left: No mass, nipple discharge, skin change or tenderness.  Neck:     Thyroid: No thyromegaly.  Cardiovascular:     Rate and Rhythm: Normal rate and regular rhythm.     Heart sounds: Normal heart sounds. No murmur heard. Pulmonary:     Effort: Pulmonary effort is normal.     Breath sounds: Normal breath sounds.  Abdominal:     Palpations: Abdomen is soft.     Tenderness: There is no abdominal tenderness. There is no guarding.  Musculoskeletal:        General: Normal range of motion.     Cervical back: Normal range of motion.  Neurological:     General: No focal deficit present.     Mental Status: She is alert and oriented to person, place, and time.     Cranial Nerves: No cranial nerve deficit.  Skin:    General: Skin is warm and dry.  Psychiatric:        Mood and Affect: Mood normal.        Behavior: Behavior normal.        Thought Content: Thought content normal.        Judgment: Judgment normal.  Vitals reviewed.    Assessment/Plan:  Encounter for annual routine gynecological examination  Cervical cancer screening - Plan: Cytology - PAP  Screening for HPV (human papillomavirus) - Plan: Cytology - PAP  Encounter for screening mammogram for malignant neoplasm of breast - Plan: MM 3D SCREENING MAMMOGRAM BILATERAL BREAST; pt to schedule mammo 7/25  Screening for colon cancer--pt to schedule f/u with GI, will  do ref prn.   Perimenopausal vasomotor symptoms--discussed veozah vs HRT; check labs first. Will f/u with tx options based on lab results.   Menopause - Plan: Follicle stimulating hormone, Estradiol            GYN counsel breast self exam, mammography screening, adequate intake of calcium and vitamin D, diet and exercise     F/U  Return in about 1 year (around 09/16/2024).  Henchy Mccauley B. Bienvenido Proehl, PA-C 09/17/2023 4:27 PM

## 2023-09-17 NOTE — Patient Instructions (Signed)
I value your feedback and you entrusting us with your care. If you get a [facility] patient survey, I would appreciate you taking the time to let us know about your experience today. Thank you! ? ? ?

## 2023-09-18 LAB — FOLLICLE STIMULATING HORMONE: FSH: 89.6 m[IU]/mL

## 2023-09-18 LAB — ESTRADIOL: Estradiol: 15.7 pg/mL

## 2023-09-20 ENCOUNTER — Encounter: Payer: Self-pay | Admitting: Obstetrics and Gynecology

## 2023-09-20 DIAGNOSIS — N951 Menopausal and female climacteric states: Secondary | ICD-10-CM

## 2023-09-21 MED ORDER — VEOZAH 45 MG PO TABS: 45.0000 mg | ORAL_TABLET | Freq: Every day | ORAL | 2 refills | Status: AC

## 2023-09-21 NOTE — Telephone Encounter (Signed)
Pls leave her 2 samples veozah. Thx.

## 2023-09-22 LAB — CYTOLOGY - PAP
Adequacy: ABSENT
Comment: NEGATIVE
Diagnosis: NEGATIVE
High risk HPV: NEGATIVE

## 2023-09-24 NOTE — Telephone Encounter (Addendum)
Pt info on covermymeds is not matching our information. I called pt to verify and she said she will use samples now and starting 09/30/23 she will have [employer] and we will wait until then if PA needed.

## 2023-10-07 ENCOUNTER — Other Ambulatory Visit: Payer: Self-pay

## 2023-10-07 ENCOUNTER — Telehealth: Payer: Self-pay | Admitting: Gastroenterology

## 2023-10-07 DIAGNOSIS — Z860100 Personal history of colon polyps, unspecified: Secondary | ICD-10-CM

## 2023-10-07 MED ORDER — ONDANSETRON HCL 4 MG PO TABS: 4.0000 mg | ORAL_TABLET | Freq: Three times a day (TID) | ORAL | 0 refills | Status: AC | PRN

## 2023-10-07 MED ORDER — SUTAB 1479-225-188 MG PO TABS: 12.0000 | ORAL_TABLET | Freq: Two times a day (BID) | ORAL | 0 refills | Status: AC

## 2023-10-07 NOTE — Telephone Encounter (Signed)
 Gastroenterology Pre-Procedure Review  Request Date: 11/26/23 Requesting Physician: Dr. Unk  PATIENT REVIEW QUESTIONS: The patient responded to the following health history questions as indicated:    1. Are you having any GI issues? no 2. Do you have a personal history of Polyps? yes (last colonoscopy performed by dr. Unk 11/03/22 recommended repeat in 1 year due to inadequate prep. Polyp was removed.) 3. Do you have a family history of Colon Cancer or Polyps? no 4. Diabetes Mellitus? no 5. Joint replacements in the past 12 months?no 6. Major health problems in the past 3 months?no 7. Any artificial heart valves, MVP, or defibrillator?no    MEDICATIONS & ALLERGIES:    Patient reports the following regarding taking any anticoagulation/antiplatelet therapy:   Plavix, Coumadin, Eliquis, Xarelto, Lovenox, Pradaxa, Brilinta, or Effient? no Aspirin? no  Patient confirms/reports the following medications:  Current Outpatient Medications  Medication Sig Dispense Refill   ondansetron  (ZOFRAN ) 4 MG tablet Take 1 tablet (4 mg total) by mouth 3 (three) times daily as needed for up to 2 days for nausea or vomiting. For nausea associated with intolerance of colonoscopy prep 6 tablet 0   Sodium Sulfate-Mag Sulfate-KCl (SUTAB ) [phone] MG TABS Take 12 tablets by mouth 2 (two) times daily for 1 day. 24 tablet 0   cetirizine (ZYRTEC) 10 MG tablet Take 10 mg by mouth daily as needed.      escitalopram (LEXAPRO) 20 MG tablet Take 20 mg by mouth daily.      Fezolinetant  (VEOZAH ) 45 MG TABS Take 1 tablet (45 mg total) by mouth daily. 30 tablet 2   fluticasone (FLONASE) 50 MCG/ACT nasal spray SHAKE LIQUID AND USE 1 SPRAY IN EACH NOSTRIL TWICE DAILY     levothyroxine  (SYNTHROID ) 88 MCG tablet Take 88 mcg by mouth daily.     LORazepam  (ATIVAN ) 0.5 MG tablet TAKE 1 TABLET(0.5 MG) BY MOUTH EVERY 8 HOURS AS NEEDED FOR ANXIETY 30 tablet 0   losartan (COZAAR) 25 MG tablet Take 25 mg by mouth daily.   3    No current facility-administered medications for this visit.    Patient confirms/reports the following allergies:  Allergies  Allergen Reactions   Clarithromycin Other (See Comments)    Makes pt drool uncontrollably    Orders Placed This Encounter  Procedures   Ambulatory referral to Gastroenterology    Referral Priority:   Routine    Referral Type:   Consultation    Referral Reason:   Specialty Services Required    Referred to Provider:   Unk Corinn Skiff, MD    Number of Visits Requested:   1    AUTHORIZATION INFORMATION Primary Insurance: 1D#: Group #:  Secondary Insurance: 1D#: Group #:  SCHEDULE INFORMATION: Date: 11/26/23 Time: Location: [facility]

## 2023-10-07 NOTE — Telephone Encounter (Signed)
 The patient called in to schedule repeat colonoscopy. Per Dr. Allegra Lai I recommend you have a repeat colonoscopy in 1 year with a 2-day prep to determine if you have developed any new polyps and to screen for colorectal cancer.

## 2023-10-07 NOTE — Addendum Note (Signed)
 Addended by: Avie Arenas on: 10/07/2023 11:38 AM   Modules accepted: Orders

## 2023-11-24 ENCOUNTER — Telehealth: Payer: Self-pay | Admitting: Gastroenterology

## 2023-11-24 NOTE — Telephone Encounter (Signed)
 Patient has requested to cancel her 11/26/23 colonoscopy due to out of pocket estimate $2,000. Explained to her that this was just an out of pocket estimate.  She can pay what she can up front..  She said it may just be her insurance.  I advised her to call to discuss with her insurance provider although she has requested to still have her procedure canceled.  I will call endo in the am to cancel procedure.  Thanks, [place], [place]

## 2023-11-24 NOTE — Telephone Encounter (Signed)
 Pt requesting call back to discuss procedure cost

## 2023-11-25 ENCOUNTER — Encounter: Payer: Self-pay | Admitting: Obstetrics and Gynecology

## 2023-11-25 ENCOUNTER — Telehealth: Payer: Self-pay

## 2023-11-25 NOTE — Telephone Encounter (Signed)
 Rosann Auerbach called and states that Patient states she can not afford her colonsocopy that is schedule for 11/26/2023. Please call patient per Endo

## 2023-11-25 NOTE — Telephone Encounter (Signed)
 Thanks-spoke with patient yesterday after Endo had already left.  Procedure canceled.  Thanks, [place], [place]

## 2023-11-26 ENCOUNTER — Ambulatory Visit: Admission: RE | Admit: 2023-11-26 | Payer: 59 | Source: Home / Self Care | Admitting: Gastroenterology

## 2023-11-26 SURGERY — COLONOSCOPY WITH PROPOFOL
Anesthesia: General

## 2024-04-14 ENCOUNTER — Ambulatory Visit
Admission: RE | Admit: 2024-04-14 | Discharge: 2024-04-14 | Disposition: A | Discharge status: Home / Self Care | Source: Ambulatory Visit | Attending: Obstetrics and Gynecology | Admitting: Obstetrics and Gynecology

## 2024-04-14 DIAGNOSIS — Z1231 Encounter for screening mammogram for malignant neoplasm of breast: Secondary | ICD-10-CM | POA: Diagnosis present

## 2024-04-19 ENCOUNTER — Ambulatory Visit: Payer: Self-pay | Admitting: Obstetrics and Gynecology

## 2024-05-25 ENCOUNTER — Encounter: Payer: Self-pay | Admitting: Obstetrics and Gynecology

## 2024-07-12 ENCOUNTER — Other Ambulatory Visit: Payer: Self-pay

## 2024-07-12 DIAGNOSIS — R1011 Right upper quadrant pain: Secondary | ICD-10-CM

## 2024-07-13 ENCOUNTER — Ambulatory Visit
Admission: RE | Admit: 2024-07-13 | Discharge: 2024-07-13 | Disposition: A | Discharge status: Home / Self Care | Source: Ambulatory Visit

## 2024-07-13 DIAGNOSIS — R1011 Right upper quadrant pain: Secondary | ICD-10-CM | POA: Diagnosis present

## 2024-11-18 ENCOUNTER — Ambulatory Visit: Admitting: Licensed Practical Nurse
